# Patient Record
Sex: Male | Born: 1965 | Race: White | Hispanic: No | Marital: Married | Smoking: Never smoker
Health system: Southern US, Community
[De-identification: ages and names within clinical notes are randomized; demographics above are authoritative.]

## PROBLEM LIST (undated history)

## (undated) DIAGNOSIS — I10 Essential (primary) hypertension: Secondary | ICD-10-CM

## (undated) DIAGNOSIS — I2510 Atherosclerotic heart disease of native coronary artery without angina pectoris: Secondary | ICD-10-CM

## (undated) DIAGNOSIS — G4730 Sleep apnea, unspecified: Secondary | ICD-10-CM

## (undated) DIAGNOSIS — I82409 Acute embolism and thrombosis of unspecified deep veins of unspecified lower extremity: Secondary | ICD-10-CM

## (undated) DIAGNOSIS — I499 Cardiac arrhythmia, unspecified: Secondary | ICD-10-CM

## (undated) DIAGNOSIS — M1990 Unspecified osteoarthritis, unspecified site: Secondary | ICD-10-CM

## (undated) DIAGNOSIS — F419 Anxiety disorder, unspecified: Secondary | ICD-10-CM

## (undated) DIAGNOSIS — K219 Gastro-esophageal reflux disease without esophagitis: Secondary | ICD-10-CM

## (undated) DIAGNOSIS — E785 Hyperlipidemia, unspecified: Secondary | ICD-10-CM

## (undated) DIAGNOSIS — Z8719 Personal history of other diseases of the digestive system: Secondary | ICD-10-CM

## (undated) HISTORY — DX: Hyperlipidemia, unspecified: E78.5

## (undated) HISTORY — DX: Essential (primary) hypertension: I10

## (undated) HISTORY — DX: Acute embolism and thrombosis of unspecified deep veins of unspecified lower extremity: I82.409

## (undated) HISTORY — PX: UMBILICAL HERNIA REPAIR: SUR1181

---

## 2004-09-23 ENCOUNTER — Encounter: Admission: RE | Admit: 2004-09-23 | Discharge: 2004-09-23 | Payer: Self-pay | Admitting: Family Medicine

## 2008-07-20 ENCOUNTER — Emergency Department (HOSPITAL_COMMUNITY): Admission: EM | Admit: 2008-07-20 | Discharge: 2008-07-20 | Payer: Self-pay | Admitting: Emergency Medicine

## 2008-09-17 ENCOUNTER — Observation Stay (HOSPITAL_COMMUNITY): Admission: RE | Admit: 2008-09-17 | Discharge: 2008-09-19 | Payer: Self-pay | Admitting: Internal Medicine

## 2008-09-17 ENCOUNTER — Ambulatory Visit: Payer: Self-pay | Admitting: Vascular Surgery

## 2008-09-17 ENCOUNTER — Encounter (INDEPENDENT_AMBULATORY_CARE_PROVIDER_SITE_OTHER): Payer: Self-pay | Admitting: Orthopedic Surgery

## 2008-12-04 ENCOUNTER — Ambulatory Visit: Payer: Self-pay | Admitting: Vascular Surgery

## 2009-09-03 ENCOUNTER — Ambulatory Visit: Payer: Self-pay | Admitting: Vascular Surgery

## 2010-07-14 ENCOUNTER — Emergency Department (HOSPITAL_COMMUNITY)
Admission: EM | Admit: 2010-07-14 | Discharge: 2010-07-14 | Disposition: A | Discharge status: Home / Self Care | Payer: 59 | Attending: Emergency Medicine | Admitting: Emergency Medicine

## 2010-07-14 DIAGNOSIS — W208XXA Other cause of strike by thrown, projected or falling object, initial encounter: Secondary | ICD-10-CM | POA: Insufficient documentation

## 2010-07-14 DIAGNOSIS — S0100XA Unspecified open wound of scalp, initial encounter: Secondary | ICD-10-CM | POA: Insufficient documentation

## 2010-07-14 DIAGNOSIS — Y929 Unspecified place or not applicable: Secondary | ICD-10-CM | POA: Insufficient documentation

## 2010-08-26 LAB — CBC
HCT: 36.3 % — ABNORMAL LOW (ref 39.0–52.0)
Hemoglobin: 12.6 g/dL — ABNORMAL LOW (ref 13.0–17.0)
MCHC: 34.6 g/dL (ref 30.0–36.0)
MCV: 87.4 fL (ref 78.0–100.0)
Platelets: 348 10*3/uL (ref 150–400)
RDW: 12.4 % (ref 11.5–15.5)
RDW: 12.8 % (ref 11.5–15.5)
WBC: 10.1 10*3/uL (ref 4.0–10.5)
WBC: 7.8 10*3/uL (ref 4.0–10.5)

## 2010-08-26 LAB — COMPREHENSIVE METABOLIC PANEL
Alkaline Phosphatase: 100 U/L (ref 39–117)
Chloride: 107 mEq/L (ref 96–112)
Creatinine, Ser: 1.14 mg/dL (ref 0.4–1.5)
GFR calc Af Amer: >60 mL/min (ref >60)
GFR calc non Af Amer: >60 mL/min (ref >60)
Glucose, Bld: 105 mg/dL — ABNORMAL HIGH (ref 70–99)

## 2010-08-26 LAB — APTT: aPTT: 32 seconds (ref 24–37)

## 2010-08-26 LAB — PROTIME-INR
INR: 1.1 (ref 0.00–1.49)
INR: 1.1 (ref 0.00–1.49)
Prothrombin Time: 14 seconds (ref 11.6–15.2)
Prothrombin Time: 14.3 seconds (ref 11.6–15.2)

## 2010-09-30 NOTE — Assessment & Plan Note (Signed)
OFFICE VISIT   [name] [name], [name] [name]  DOB:  [birth year]                                       09/03/2009  [id number]#:[id number]   The patient is a 45 year old male who had suffered a DVT of his right  leg and superficial femoral, popliteal and tibial veins in May of 2010  after Achilles tendon repair by Dr. Bettina Gavia.  He has been on Coumadin  since that time and I evaluated him in July of 2010.  He returns today  for followup.  The swelling which was fairly severe when I saw him in  July has dramatically improved with the ankle now being only 1.5 to 2 cm  larger on the right and the left calf being equal size and distal thigh  being equal size.  He is not wearing an elastic compression stocking on  a regular basis and does notice swelling in the ankle as the day  progresses.  He does elevate the leg somewhat at night.  He continues on  Coumadin which he is tolerating well.   REVIEW OF SYSTEMS:  Denies any chest pain, dyspnea on exertion, PND,  orthopnea, no chronic bronchitis, wheezing or asthma.   PHYSICAL EXAM:  Vital signs:  Blood pressure 136/88, heart rate 51,  respirations 14.  General:  He is alert and oriented x3 in no apparent  distress.  Lower extremity exam reveals 3+ femoral, popliteal and  dorsalis pedis pulses bilaterally.  Right ankle is 2 cm larger in  circumference than the left.  Proximal calf and thigh are equal in  circumference.  He has a few varicosities but no stasis ulcers.   Today I ordered a venous duplex exam which I reviewed and interpreted.  He has no evidence of thrombus in the right superficial femoral or  popliteal vein which is now clear.  Tibials were not well visualized.  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   I think it would be okay to discontinue his Coumadin at this point and  convert to aspirin only.  He should also wear short leg elastic  compression stocking and  elevate his leg effectively at night and return  to see [place] on a p.r.n. basis.     Quita Skye Hart [place], M.D.  Electronically Signed   JDL/MEDQ  D:  09/03/2009  T:  09/04/2009  Job:  [id number]   cc:   Claude Manges, PA

## 2010-09-30 NOTE — H&P (Signed)
[name], [name]              ACCOUNT NO.:  [id number]   MEDICAL RECORD NO.:  [id number]          PATIENT TYPE:  OBV   LOCATION:  4705                         FACILITY:  [facility]   PHYSICIAN:  [name] [name], M.D.DATE OF BIRTH:  10/07/1965   DATE OF ADMISSION:  09/17/2008  DATE OF DISCHARGE:                              HISTORY & PHYSICAL   PRIMARY CARE [name] [name]:  [name] Manges, NP, of [facility]   ORTHOPEDIST:  Dr. Thurston [name].   CHIEF COMPLAINT:  Leg pain and swelling.   HISTORY OF PRESENT ILLNESS:  The patient is a 45 year old white male  with past medical history of borderline hypertension, hyperlipidemia who  underwent a right Achilles tendon repair on August 04, 2008.  Before that  time, he had been somewhat immobile, and afterwards, he had been wearing  a boot.  He followed up today in Dr. Sherene [name] office complaining of  severe leg pain and swelling which had been going on for several days.  He had noted some fevers as of late and brief episode of some chest  discomfort earlier for several weeks back.  When he came in, it was  noted that his right lower extremity was markedly swollen from the mid-  thigh down, and so they sent him over for Doppler for suspected DVT.  The patient's lower extremity Doppler noted a significant DVT involving  most of the patient's right lower extremity.  [facility] were  consulted by the orthopedic physician for admission.  The patient was  then sent from vascular directly to telemetry floor.  He was started on  Lovenox and Coumadin.  When I saw the patient, he was doing well.  He  denies any headaches, vision changes, dysphagia, chest pain,  palpitations, shortness of breath, wheeze, cough, abdominal pain,  hematuria, dysuria, constipation, diarrhea, focal extremity numbness,  weakness, and his pain was better after receiving some medicine for  pain.   REVIEW OF SYSTEMS:  Otherwise negative.   PAST MEDICAL HISTORY:   Includes borderline hypertension, not on any  medicines, and hyperlipidemia.   MEDICATIONS:  He is on Zocor.  He does not remember the dose.  He also  takes Vicodin which he has been __________ as of late after his Achilles  repair in the last month.   ALLERGIES:  NO KNOWN DRUG ALLERGIES.   SOCIAL HISTORY:  Denies any tobacco, heavy alcohol or drug use.   FAMILY HISTORY:  Noncontributory.   PHYSICAL EXAMINATION:  VITALS SIGNS:  On admission to the floor,  temperature 99.1, heart rate 78, blood pressure 138/94, O2 saturation  96% on room air.  IN GENERAL:  He is alert and oriented x3.  In no  apparent distress.  HEENT:  Normocephalic, atraumatic.  Mucous membranes are moist.  NECK:  He has no carotid bruits.  HEART:  Regular rate and rhythm, S1, S2.  LUNGS:  Clear to auscultation bilaterally.  ABDOMEN:  Soft, obese, nontender, positive bowel sounds.  EXTREMITIES:  Show no clubbing or cyanosis.  His right lower extremity  from the mid-thigh down is +1 edema, much more swollen than  the left.  He has normal sensation, intact.   LABORATORY WORK:  I have ordered a CBC, BMET and PT/INR, all of which  are pending.   ASSESSMENT AND PLAN:  1. Deep venous thrombosis.  Will plan to start Lovenox and Coumadin.      The patient is a well informed and educated, and after he is      comfortable with self-administration, we will plan to discharge      home with close followup.  2. Borderline hypertension.  Continue to monitor.  Slightly elevated      secondary to pain.  3. Hyperlipidemia.  Continue Zocor.      [name] [name], M.D.  Electronically Signed     [name] [name]/MEDQ  D:  09/17/2008  T:  09/17/2008  Job:  [id number]   cc:   [name] [name], M.D.  [name] [name], M.D.  [name] Manges, NP

## 2010-09-30 NOTE — Procedures (Signed)
DUPLEX DEEP VENOUS EXAM - LOWER EXTREMITY   INDICATION:  Followup evaluation of previous DVT.   HISTORY:  Edema:  Yes.  Trauma/Surgery:  Yes.  Pain:  Yes.  PE:  No.  Previous DVT:  Yes.  Anticoagulants:  Yes.  Other:   DUPLEX EXAM:                CFV   SFV   PopV  PTV    GSV                R  L  R  L  R  L  R   L  R  L  Thrombosis    o  o  +  o  +  o  o   o  o  o  Spontaneous   +  +  P  +  P  +  +   +  +  +  Phasic        +  +  o  +  o  +  +   +  +  +  Augmentation  +  +  P  +  P  +  +   +  +  +  Compressible  +  +  P  +  P  +  +   +  +  +  Competent     +  +  +  +  +  +  +   +  +  +   Legend:  + - yes  o - no  p - partial  D - decreased    IMPRESSION:  1. Deep vein thrombosis noted in the right mid to distal superficial      femoral and popliteal vein.  2. Superficial vein thrombosis was noted in right lesser saphenous      vein.  3. No evidence of deep vein thrombosis in left lower extremity.  4. Unable to image the right peroneal vein due to swelling in the      calf.         _____________________________  Quita Skye. Hart [place], M.D.   AC/MEDQ  D:  12/04/2008  T:  12/05/2008  Job:  [id number]

## 2010-09-30 NOTE — Consult Note (Signed)
NEW PATIENT CONSULTATION   BLAS, RICHES  DOB:  1965/06/09                                       12/04/2008  [id number]#:[id number]   The patient is a 45 year old healthy male who said Achilles tendon  rupture while playing basketball in March of this year.  He was placed  in a boot after surgical repair and in May when the boot was removed he  had significant edema and was found to have a deep venous thrombosis  fairly extensive in his right leg.  He has been treated with Lovenox and  Coumadin and has had persistent edema, returns now for further  evaluation and recommendations for long-term care.  He has had no  previous history of thrombotic problems and has not been wearing elastic  compression stockings and has had persistent edema from the thigh to the  foot.  He has no symptoms in the left leg.   PAST MEDICAL HISTORY:  1. Hypertension.  2. Hyperlipidemia.  3. Negative for diabetes, coronary artery disease, COPD or stroke.   PAST SURGICAL HISTORY:  1. Achilles tendon repair.  2. Vasectomy.   FAMILY HISTORY:  Positive for coronary artery disease and stroke in his  father who had his first myocardial infarction age 45.  Negative for  diabetes.   SOCIAL HISTORY:  Married, has three children and works as a Scientific laboratory technician.  Does not use tobacco.  Drinks occasional alcohol.   REVIEW OF SYSTEMS:  Positive for some recent weight gain, has had some  discomfort in his legs with ambulation but no other specific symptoms.   ALLERGIES:  None known.   MEDICATIONS:  Include simvastatin and Coumadin.   PHYSICAL EXAM:  Vital signs:  Blood pressure is 120/90, heart rate 64,  respirations 20.  General:  He is a healthy-appearing middle-aged male  in no apparent distress, alert and oriented x3.  Neck:  Supple.  3+  carotid pulses palpable.  No bruits are audible.  Neurological:  Normal.  No palpable adenopathy in the neck.  Chest:  Clear to auscultation.  Cardiovascular:  Regular rhythm.  No murmurs.  Abdomen:  Soft, nontender  with no masses.  He has 3+ femoral, popliteal and dorsalis pedis pulses  bilaterally.  Right leg is obviously swollen compared to the left from  the mid thigh to the foot.  The ankle is 4.5 cm larger in circumference,  mid calf 2.5 cm larger and distal thigh 2.5 cm larger than the  contralateral leg.  There are no varicosities or stasis ulcers noted and  he does have some bluish congestion.   Venous duplex exam today reveals thrombosis of the right mid superficial  femoral vein and distal superficial vein as well as popliteal vein and  thrombosis in the right lesser saphenous vein.  There is no DVT in the  left leg.   I think he should continue on Coumadin for at least 6 months because of  his extensive DVT and persistent edema and at that time we could  possibly stop the Coumadin and give him a trial and if he does not have  further problems he could stay off Coumadin with aspirin only.  I also  instructed him in elevating the leg at night and applying a long leg  elastic compression stocking early in the morning to try to minimize  his  edema.  We fitted him for a stocking today.  He will return to see me in  4-5 months with a followup venous duplex exam.   Quita Skye. Hart [place], M.D.  Electronically Signed   JDL/MEDQ  D:  12/04/2008  T:  12/05/2008  Job:  [id number]   cc:   Dr Beryl Meager A. Thurston Hole, M.D.

## 2010-09-30 NOTE — Procedures (Signed)
DUPLEX DEEP VENOUS EXAM - LOWER EXTREMITY   INDICATION:  Follow up right leg DVT.   HISTORY:  Edema:  Yes.  Trauma/Surgery:  No.  Pain:  No.  PE:  No.  Previous DVT:  Right superficial femoral artery, popliteal, and lesser  saphenous.  Anticoagulants:  Yes.  Other:  No.   DUPLEX EXAM:                CFV   SFV   PopV  PTV         GSV                R  L  R  L  R  L  R        L  R  L  Thrombosis    o  o  o     o     Not visualized    o  Spontaneous   +  +  +     +     Not visualized    +  Phasic        +  +  +     +     Not visualized    +  Augmentation  +  +  +     +     Not visualized    +  Compressible  +  +  +     +     Not visualized    +  Competent     +  +  +     +     Not visualized    +   Legend:  + - yes  o - no  p - partial  D - decreased   IMPRESSION:  1. The right leg appears to be free of deep venous thrombus.  2. The right posterior tibial and peroneals were not visualized well.  3. The right lesser saphenous vein appears with chronic superficial      thrombus.    _____________________________  Quita Skye Hart [place], M.D.   CB/MEDQ  D:  09/03/2009  T:  09/03/2009  Job:  [id number]

## 2010-10-03 NOTE — Discharge Summary (Signed)
NAMECONOR, [name] [name] NO.:  [id number]   MEDICAL RECORD NO.:  [id number]          PATIENT TYPE:  OBV   LOCATION:  4705                         FACILITY:  [facility]   PHYSICIAN:  Hollice Espy, M.D.DATE OF BIRTH:  01-23-1966   DATE OF ADMISSION:  09/17/2008  DATE OF DISCHARGE:  09/19/2008                               DISCHARGE SUMMARY   PRIMARY CARE PHYSICIAN:  Claude Manges, NP, overseen by Lavonda Jumbo,  MD, at [facility].   DISCHARGE DIAGNOSES:  1. Right lower extremity deep venous thrombosis.  2. History of hyperlipidemia.  3. History of hypertension.   DISCHARGE MEDICATIONS:  1. Coumadin 10 p.o. nightly.  2. Lovenox 100 mg subcu q.12 h.   The patient will continue on both medicines until she will follows up  with Dr. Orvan July office and then have a Coumadin level recheck.  Once his  INR is therapeutic between 2-3, we may discontinue on the Lovenox.   Mean time, he continue the rest of his medicines, these are as follows;  1. Zocor 20 nightly.  2. Vicodin 1 tablet p.o. q.4-6 h. p.r.n. for pain.  3. Methocarbamol 500 p.o. daily p.r.n.   DISCHARGE DIET:  Low-sodium diet.   ACTIVITY:  As tolerated   DISPOSITION:  Improved.   HOSPITAL COURSE:  The patient is 45 year old white male with complaining  of some lower extremity swelling x1 day that has a right leg DVT and  vascular lab __________ to be admitted.  He had had a recent orthopedic  procedure.  He was started on Coumadin and Lovenox.  He had had some  previous experience with this.  Given that he had a family member on  Coumadin who is actually medically stable, arrangements were made for  him to go  home on home Lovenox and Coumadin, of which then he could continue to  take both, see Dr. Barry Dienes' office for repeat Coumadin check.  Appointment  was made for him to follow up with Claude Manges at 9:30 a.m. on Sep 20, 2008, one day after discharge.  The patient's overall disposition  is  improved.  He was discharged to home.      Hollice Espy, M.D.  Electronically Signed     SKK/MEDQ  D:  10/24/2008  T:  10/25/2008  Job:  [id number]   cc:   Claude Manges, NP

## 2011-05-15 ENCOUNTER — Ambulatory Visit (INDEPENDENT_AMBULATORY_CARE_PROVIDER_SITE_OTHER): Payer: 59 | Admitting: *Deleted

## 2011-05-15 DIAGNOSIS — R229 Localized swelling, mass and lump, unspecified: Secondary | ICD-10-CM

## 2011-05-15 DIAGNOSIS — I82819 Embolism and thrombosis of superficial veins of unspecified lower extremities: Secondary | ICD-10-CM

## 2011-05-25 NOTE — Procedures (Unsigned)
DUPLEX DEEP VENOUS EXAM - LOWER EXTREMITY  INDICATION:  Knot, right lateral upper calf.  HISTORY:  Edema:  Yes. Trauma/Surgery:  No. Pain:  Yes. PE:  No. Previous DVT:  Right lower extremity deep venous thrombosis 2 years ago, mid thigh distally. Anticoagulants:  No. Other:  DUPLEX EXAM:               CFV   SFV   PopV  PTV    GSV               R  L  R  L  R  L  R   L  R  L Thrombosis    o     +     +     o      o Spontaneous   +     0     0     +      + Phasic        +     +     0     +      + Augmentation  +     +     0     +      + Compressible  +     P     0     +      + Competent     0     0     0     +      +  Legend:  + - yes  o - no  p - partial  D - decreased  IMPRESSION: 1. Partial to no compression in the right distal femoral to the     popliteal vein, age undetermined, cannot rule out acute deep venous     thrombosis. 2. Patient has a prior history of extensive deep venous thrombosis. 3. Noncompressible superficial varicosities at the right lateral calf,     suggestive of acute superficial thrombophlebitis.   _____________________________ V. Charlena Cross, MD  SS/MEDQ  D:  05/15/2011  T:  05/15/2011  Job:  [id number]

## 2011-06-19 ENCOUNTER — Encounter: Payer: Self-pay | Admitting: Vascular Surgery

## 2011-06-22 ENCOUNTER — Encounter: Payer: Self-pay | Admitting: Vascular Surgery

## 2011-06-23 ENCOUNTER — Ambulatory Visit (INDEPENDENT_AMBULATORY_CARE_PROVIDER_SITE_OTHER): Payer: 59 | Admitting: Vascular Surgery

## 2011-06-23 ENCOUNTER — Encounter: Payer: Self-pay | Admitting: Vascular Surgery

## 2011-06-23 VITALS — BP 142/93 | HR 60 | Resp 16 | Ht 72.0 in | Wt 243.0 lb

## 2011-06-23 DIAGNOSIS — I8000 Phlebitis and thrombophlebitis of superficial vessels of unspecified lower extremity: Secondary | ICD-10-CM

## 2011-06-23 DIAGNOSIS — I824Z9 Acute embolism and thrombosis of unspecified deep veins of unspecified distal lower extremity: Secondary | ICD-10-CM

## 2011-06-23 NOTE — Progress Notes (Signed)
Subjective:     Patient ID: [name] [name], male   DOB: 08/14/1965, 46 y.o.   MRN: [id number]  HPI this 46 year old male patient returns for further discussion about his venous disease in the right leg. I evaluated him a few years ago for DVT in the right superficial femoral and popliteal vein which occurred following an Achilles tendon repair. He was treated for Coumadin for a period of time and in the clot lysed and his Coumadin was discontinued following on May 2011 appointment since then he has done well until 6 weeks ago when he developed superficial thrombophlebitis in the lateral aspect of his right calf. He felt a firm knot in this area. He had a venous duplex exam performed in our office on 05/15/2011 which I have reviewed. He had DVT in the right popliteal and superficial femoral vein and thrombosis of superficial varicosities. This DVT was not present in April of 2011. He is not been taking aspirin or Coumadin until after this event and he was started on aspirin. He is not worn elastic compression stockings or regular basis. Short-leg stockings cause swelling above the stocking appear  Past Medical History  Diagnosis Date  . Hyperlipidemia   . Hypertension   . DVT (deep venous thrombosis)     History  Substance Use Topics  . Smoking status: Never Smoker   . Smokeless tobacco: Not on file  . Alcohol Use: Not on file    Family History  Problem Relation Age of Onset  . Stroke Father     Not on File  Current outpatient prescriptions:aspirin 81 MG tablet, Take 81 mg by mouth daily., Disp: , Rfl: ;  Multiple Vitamin (MULITIVITAMIN) LIQD, Take 5 mLs by mouth daily., Disp: , Rfl: ;  omeprazole (PRILOSEC) 10 MG capsule, Take 10 mg by mouth daily., Disp: , Rfl: ;  aspirin 325 MG EC tablet, Take 325 mg by mouth daily., Disp: , Rfl: ;  ciprofloxacin (CIPRO) 500 MG/5ML (10%) suspension, Take by mouth 2 (two) times daily., Disp: , Rfl:  fenofibrate (TRICOR) 48 MG tablet, Take 48 mg by mouth  daily., Disp: , Rfl: ;  fenofibrate micronized (ANTARA) 130 MG capsule, Take 130 mg by mouth daily before breakfast., Disp: , Rfl: ;  sulfacetamide (BLEPH-10) 10 % ophthalmic solution, 1 drop every 3 (three) hours., Disp: , Rfl:   BP 142/93  Pulse 60  Resp 16  Ht 6' (1.829 m)  Wt 243 lb (110.224 kg)  BMI 32.96 kg/m2  SpO2 98%  Body mass index is 32.96 kg/(m^2).        Review of Systems denies chest pain, dyspnea on exertion, PND, orthopnea, hemoptysis, claudication. All systems are negative and a complete review of systems    Objective:   Physical Exam blood pressure 142/93 heart rate 60 respirations 16 General well-developed well-nourished male no apparent stress alert and oriented x3 HEENT normal for age Lungs no rhonchi or wheezing Cardiovascular regular rhythm no murmurs carotid pulses 3+ no audible bruits Abdomen soft nontender with no masses Neurologic normal Musculoskeletal 3 major deformities Lower extremity exam reveals 1-1-1/2 cm of circumference discrepancy right greater than left in the calf and thigh areas. There is no hyperpigmentation or ulceration noted. There are some thrombosed varicosities in the right lateral calf area.    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

## 2011-08-26 ENCOUNTER — Other Ambulatory Visit: Payer: Self-pay | Admitting: Vascular Surgery

## 2014-05-18 HISTORY — PX: COLONOSCOPY: SHX174

## 2016-09-22 ENCOUNTER — Other Ambulatory Visit: Payer: Self-pay | Admitting: *Deleted

## 2016-09-22 DIAGNOSIS — I8000 Phlebitis and thrombophlebitis of superficial vessels of unspecified lower extremity: Secondary | ICD-10-CM

## 2016-10-07 ENCOUNTER — Encounter: Payer: Self-pay | Admitting: Vascular Surgery

## 2016-10-19 ENCOUNTER — Ambulatory Visit (HOSPITAL_COMMUNITY)
Admission: RE | Admit: 2016-10-19 | Discharge: 2016-10-19 | Disposition: A | Discharge status: Home / Self Care | Payer: Managed Care, Other (non HMO) | Source: Ambulatory Visit | Attending: Vascular Surgery | Admitting: Vascular Surgery

## 2016-10-19 ENCOUNTER — Encounter: Payer: Self-pay | Admitting: Vascular Surgery

## 2016-10-19 ENCOUNTER — Ambulatory Visit (INDEPENDENT_AMBULATORY_CARE_PROVIDER_SITE_OTHER): Payer: Managed Care, Other (non HMO) | Admitting: Vascular Surgery

## 2016-10-19 VITALS — BP 152/104 | HR 76 | Temp 98.5°F | Resp 18 | Ht 72.5 in | Wt 256.0 lb

## 2016-10-19 DIAGNOSIS — I82511 Chronic embolism and thrombosis of right femoral vein: Secondary | ICD-10-CM | POA: Diagnosis not present

## 2016-10-19 DIAGNOSIS — I878 Other specified disorders of veins: Secondary | ICD-10-CM | POA: Diagnosis not present

## 2016-10-19 DIAGNOSIS — I8000 Phlebitis and thrombophlebitis of superficial vessels of unspecified lower extremity: Secondary | ICD-10-CM | POA: Diagnosis present

## 2016-10-19 DIAGNOSIS — M7989 Other specified soft tissue disorders: Secondary | ICD-10-CM | POA: Diagnosis present

## 2016-10-19 DIAGNOSIS — I83891 Varicose veins of right lower extremities with other complications: Secondary | ICD-10-CM | POA: Diagnosis not present

## 2016-10-19 NOTE — Progress Notes (Signed)
Subjective:     Patient ID: [name] [name], male   DOB: 1965-06-02, 52 y.o.   MRN: [id number]  HPI This 51 year old male was previously evaluated by me in 2011 and 2013. He had a history of DVT in the right superficial femoral and popliteal vein and was initially treated with anticoagulants. 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 I recommended that he remain on chronic anticoagulation indefinitely. Since that time he has had some decrease in the edema of the right leg. He's had no superficial thrombophlebitis. He is tried elastic compression stockings but they cause discomfort behind his right knee. He has no symptoms in the contralateral left leg. He continues to take Coumadin but no aspirin. He is concerned about his current status of his venous system.  Past Medical History:  Diagnosis Date  . DVT (deep venous thrombosis) ([place])   . Hyperlipidemia   . Hypertension     Social History  Substance Use Topics  . Smoking status: Never Smoker  . Smokeless tobacco: Never Used  . Alcohol use Yes    Family History  Problem Relation Age of Onset  . Stroke Father     Not on File   Current Outpatient Prescriptions:  .  atorvastatin (LIPITOR) 40 MG tablet, Take 40 mg by mouth daily., Disp: , Rfl:  .  Multiple Vitamin (MULITIVITAMIN) LIQD, Take 5 mLs by mouth daily., Disp: , Rfl:  .  omeprazole (PRILOSEC) 10 MG capsule, Take 10 mg by mouth daily., Disp: , Rfl:  .  warfarin (COUMADIN) 5 MG tablet, Take 5 mg by mouth daily., Disp: , Rfl:  .  aspirin 325 MG EC tablet, Take 325 mg by mouth daily., Disp: , Rfl:  .  aspirin 81 MG tablet, Take 81 mg by mouth daily., Disp: , Rfl:  .  ciprofloxacin (CIPRO) 500 MG/5ML (10%) suspension, Take by mouth 2 (two) times daily., Disp: , Rfl:  .  fenofibrate (TRICOR) 48 MG tablet, Take 48 mg by mouth daily.,  Disp: , Rfl:  .  fenofibrate micronized (ANTARA) 130 MG capsule, Take 130 mg by mouth daily before breakfast., Disp: , Rfl:  .  sulfacetamide (BLEPH-10) 10 % ophthalmic solution, 1 drop every 3 (three) hours., Disp: , Rfl:   Vitals:   10/19/16 1346 10/19/16 1355  BP: (!) 166/104 (!) 152/104  Pulse: 76   Resp: 18   Temp: 98.5 F (36.9 C)   TempSrc: Oral   SpO2: 95%   Weight: 256 lb (116.1 kg)   Height: 6' 0.5" (1.842 m)     Body mass index is 34.24 kg/m.         Review of Systems Denies chest pain, dyspnea on exertion, PND, orthopnea, hemoptysis, claudication    Objective:   Physical Exam BP (!) 152/104 (BP Location: Left Arm, Patient Position: Sitting, Cuff Size: Normal)   Pulse 76   Temp 98.5 F (36.9 C) (Oral)   Resp 18   Ht 6' 0.5" (1.842 m)   Wt 256 lb (116.1 kg)   SpO2 95%   BMI 34.24 kg/m     Gen.-alert and oriented x3 in no apparent distress HEENT normal for age Lungs no rhonchi or wheezing Cardiovascular regular rhythm no murmurs carotid pulses 3+ palpable no bruits audible Abdomen soft nontender no palpable masses Musculoskeletal free of  major deformities Skin clear -  no rashes Neurologic normal Lower extremities 3+ femoral and dorsalis pedis pulses palpable bilaterally with no edema on the left 1+ edema on the right below the knee Bulging varicosities lateral calf on the right from the knee level to the lateral malleolus. No hyperpigmentation or ulceration noted.  Today I ordered a venous duplex exam of both legs which I reviewed and interpreted. The right leg now is widely patent with deep vein reflux but no DVT in the superficial femoral vein and popliteal vein that was present in 2013. There is great saphenous reflux distally on the left side but not on the right in the right small saphenous vein is not competent but is not large.       Assessment:     remote history of DVT right superficial femoral popliteal veins with recurrent DVT after  anticoagulation was stopped in 2011 History of superficial thrombophlebitis right leg in 2013 with thrombosed varicosities Current status of right lower extremity venous system is no DVT but deep vein reflux is present     Plan:     No intervention is indicated at this time Would recommend continuing on chronic anticoagulation with Coumadin which he is tolerating well because he did develop recurrent DVT when this was discontinued in the past slide have recommended that he experiment more with short leg elastic compression stockings which may make his legs feel better during the day if he can find some which are comfortable to him Return to see me on a when necessary basis

## 2017-01-06 DIAGNOSIS — K219 Gastro-esophageal reflux disease without esophagitis: Secondary | ICD-10-CM | POA: Insufficient documentation

## 2017-01-06 DIAGNOSIS — Z86718 Personal history of other venous thrombosis and embolism: Secondary | ICD-10-CM | POA: Insufficient documentation

## 2017-01-06 DIAGNOSIS — E7849 Other hyperlipidemia: Secondary | ICD-10-CM | POA: Insufficient documentation

## 2017-01-06 DIAGNOSIS — R7989 Other specified abnormal findings of blood chemistry: Secondary | ICD-10-CM | POA: Insufficient documentation

## 2018-05-28 DIAGNOSIS — Z23 Encounter for immunization: Secondary | ICD-10-CM | POA: Diagnosis not present

## 2018-06-01 ENCOUNTER — Other Ambulatory Visit: Payer: Self-pay | Admitting: Gastroenterology

## 2018-06-01 DIAGNOSIS — R1900 Intra-abdominal and pelvic swelling, mass and lump, unspecified site: Secondary | ICD-10-CM | POA: Diagnosis not present

## 2018-06-01 DIAGNOSIS — K219 Gastro-esophageal reflux disease without esophagitis: Secondary | ICD-10-CM | POA: Diagnosis not present

## 2018-06-01 DIAGNOSIS — R109 Unspecified abdominal pain: Secondary | ICD-10-CM

## 2018-06-02 DIAGNOSIS — G4733 Obstructive sleep apnea (adult) (pediatric): Secondary | ICD-10-CM | POA: Diagnosis not present

## 2018-06-03 ENCOUNTER — Ambulatory Visit
Admission: RE | Admit: 2018-06-03 | Discharge: 2018-06-03 | Disposition: A | Discharge status: Home / Self Care | Payer: 59 | Source: Ambulatory Visit | Attending: Gastroenterology | Admitting: Gastroenterology

## 2018-06-03 DIAGNOSIS — R109 Unspecified abdominal pain: Secondary | ICD-10-CM

## 2018-06-03 DIAGNOSIS — K439 Ventral hernia without obstruction or gangrene: Secondary | ICD-10-CM | POA: Diagnosis not present

## 2018-06-03 MED ORDER — IOPAMIDOL (ISOVUE-300) INJECTION 61%
125.0000 mL | Freq: Once | INTRAVENOUS | Status: AC | PRN
  Administered 2018-06-03: 125 mL via INTRAVENOUS

## 2018-06-08 ENCOUNTER — Other Ambulatory Visit: Payer: Self-pay | Admitting: Gastroenterology

## 2018-06-08 DIAGNOSIS — K769 Liver disease, unspecified: Secondary | ICD-10-CM

## 2018-06-15 DIAGNOSIS — K449 Diaphragmatic hernia without obstruction or gangrene: Secondary | ICD-10-CM | POA: Diagnosis not present

## 2018-06-15 DIAGNOSIS — K219 Gastro-esophageal reflux disease without esophagitis: Secondary | ICD-10-CM | POA: Diagnosis not present

## 2018-06-22 ENCOUNTER — Ambulatory Visit
Admission: RE | Admit: 2018-06-22 | Discharge: 2018-06-22 | Disposition: A | Discharge status: Home / Self Care | Payer: 59 | Source: Ambulatory Visit | Attending: Gastroenterology | Admitting: Gastroenterology

## 2018-06-22 DIAGNOSIS — K769 Liver disease, unspecified: Secondary | ICD-10-CM | POA: Diagnosis not present

## 2018-06-22 DIAGNOSIS — D1801 Hemangioma of skin and subcutaneous tissue: Secondary | ICD-10-CM | POA: Diagnosis not present

## 2018-06-22 MED ORDER — GADOBENATE DIMEGLUMINE 529 MG/ML IV SOLN
20.0000 mL | Freq: Once | INTRAVENOUS | Status: AC | PRN
  Administered 2018-06-22: 20 mL via INTRAVENOUS

## 2018-07-26 DIAGNOSIS — N401 Enlarged prostate with lower urinary tract symptoms: Secondary | ICD-10-CM | POA: Diagnosis not present

## 2018-07-26 DIAGNOSIS — R3121 Asymptomatic microscopic hematuria: Secondary | ICD-10-CM | POA: Diagnosis not present

## 2018-07-26 DIAGNOSIS — K219 Gastro-esophageal reflux disease without esophagitis: Secondary | ICD-10-CM | POA: Diagnosis not present

## 2018-07-26 DIAGNOSIS — R350 Frequency of micturition: Secondary | ICD-10-CM | POA: Diagnosis not present

## 2018-07-26 DIAGNOSIS — I10 Essential (primary) hypertension: Secondary | ICD-10-CM | POA: Diagnosis not present

## 2018-07-26 DIAGNOSIS — E7800 Pure hypercholesterolemia, unspecified: Secondary | ICD-10-CM | POA: Diagnosis not present

## 2018-09-09 DIAGNOSIS — R3912 Poor urinary stream: Secondary | ICD-10-CM | POA: Diagnosis not present

## 2018-09-09 DIAGNOSIS — N401 Enlarged prostate with lower urinary tract symptoms: Secondary | ICD-10-CM | POA: Diagnosis not present

## 2018-09-09 DIAGNOSIS — D3002 Benign neoplasm of left kidney: Secondary | ICD-10-CM | POA: Diagnosis not present

## 2018-09-09 DIAGNOSIS — R3121 Asymptomatic microscopic hematuria: Secondary | ICD-10-CM | POA: Diagnosis not present

## 2018-09-27 DIAGNOSIS — M160 Bilateral primary osteoarthritis of hip: Secondary | ICD-10-CM | POA: Diagnosis not present

## 2018-09-27 DIAGNOSIS — M47816 Spondylosis without myelopathy or radiculopathy, lumbar region: Secondary | ICD-10-CM | POA: Diagnosis not present

## 2019-05-19 HISTORY — PX: INGUINAL HERNIA REPAIR: SUR1180

## 2019-11-06 ENCOUNTER — Other Ambulatory Visit: Payer: Self-pay | Admitting: Family Medicine

## 2019-11-06 ENCOUNTER — Ambulatory Visit
Admission: RE | Admit: 2019-11-06 | Discharge: 2019-11-06 | Disposition: A | Discharge status: Home / Self Care | Payer: 59 | Source: Ambulatory Visit | Attending: Family Medicine | Admitting: Family Medicine

## 2019-11-06 DIAGNOSIS — Z01818 Encounter for other preprocedural examination: Secondary | ICD-10-CM

## 2020-04-21 IMAGING — MR MR ABDOMEN WO/W CM
18 series · 48 of 48 positions shown · IV contrast (Multihance 20ml)
Comparison: CT on 06/03/2018

CLINICAL DATA: Indeterminate liver lesions on recent CT.

Creatinine was obtained on site at [HOSPITAL] at [HOSPITAL].
Results: Creatinine 1.2 mg/dL.
EXAM:
MRI ABDOMEN WITHOUT AND WITH CONTRAST
TECHNIQUE: Multiplanar multisequence MR imaging of the abdomen was performed
both before and after the administration of intravenous contrast.
CONTRAST:  20mL MULTIHANCE GADOBENATE DIMEGLUMINE 529 MG/ML IV SOLN

[Series 3: T2 · coronal · 5.0mm · 1.56mm/px · 2 of 40 slices shown (1 of 3)]
[im 1/40]
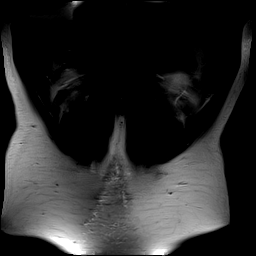
[im 40/40]
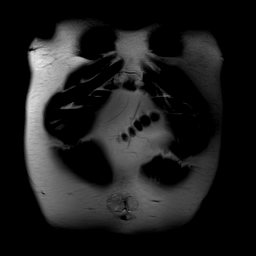

[Series 4: T1 · axial · 3.0mm · 1.19mm/px · z∈[-89,+124]mm · 5 of 144 slices shown]
[im 1/144]
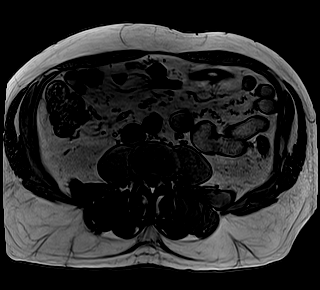
[im 36/144]
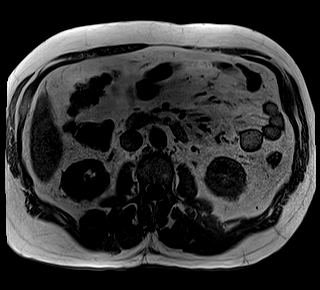
[im 72/144]
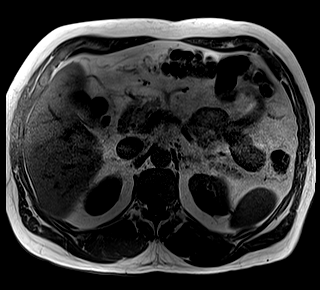
[im 108/144]
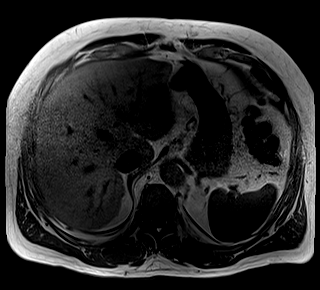
[im 144/144]
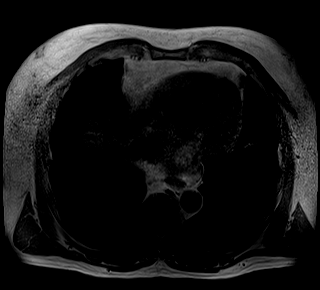

[Series 5: bSSFP · axial · 5.0mm · 1.25mm/px · 1 of 40 slices shown]
[im 1/40]
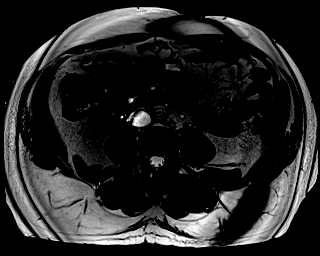

[Series 6: T2 · axial · 6.0mm · 1.19mm/px · 1 of 35 slices shown (2 of 3)]
[im 1/35]
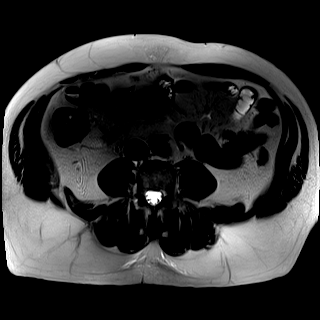

[Series 7: T2 · axial · 5.0mm · 1.48mm/px · 1 of 42 slices shown (3 of 3)]
[im 1/42]
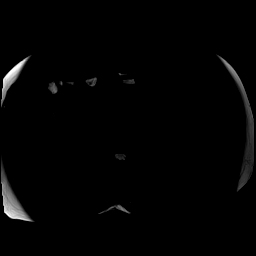

[Series 8: DWI · axial · 5.0mm · 1.42mm/px · z∈[-71,+175]mm · 4 of 126 slices shown (1 of 2)]
[im 1/126]
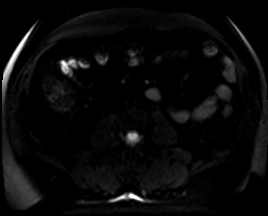
[im 42/126]
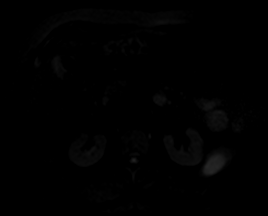
[im 84/126]
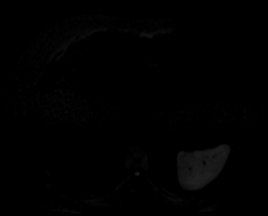
[im 126/126]
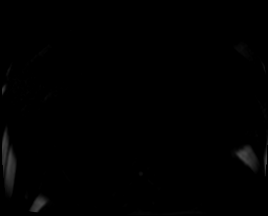

[Series 9: DWI · axial · 5.0mm · 1.42mm/px · 1 of 42 slices shown (2 of 2)]
[im 1/42]
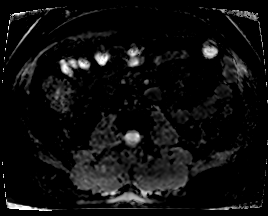

[Series 10: T1 dynamic · axial · non-contrast · 3.0mm · 1.25mm/px · z∈[-106,+131]mm · 3 of 80 slices shown (1 of 2)]
[im 1/80]
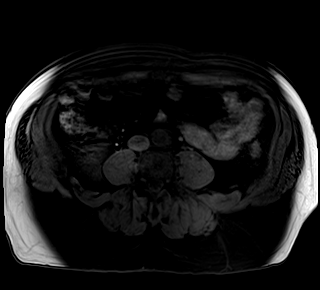
[im 40/80]
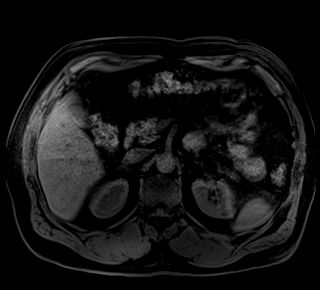
[im 80/80]
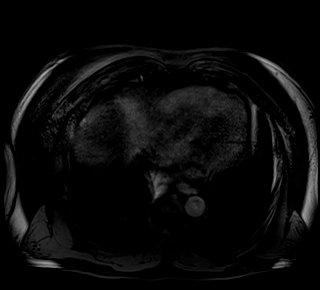

[Series 11: T1 dynamic · axial · non-contrast · 3.0mm · 1.25mm/px · z∈[-106,+131]mm · 3 of 80 slices shown (2 of 2)]
[im 1/80]
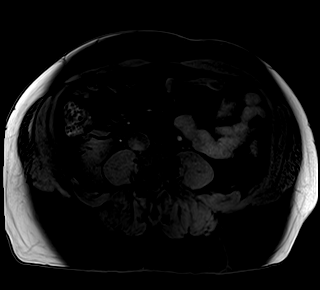
[im 40/80]
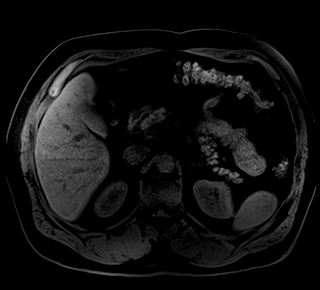
[im 80/80]
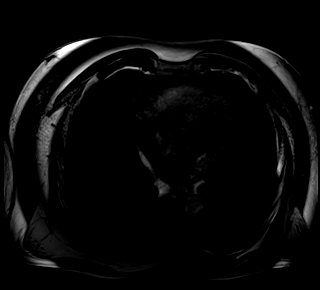

[Series 12: T1 dynamic post-contrast · axial · 3.0mm · 1.25mm/px · z∈[-106,+131]mm · 3 of 80 slices shown (1 of 9)]
[im 1/80]
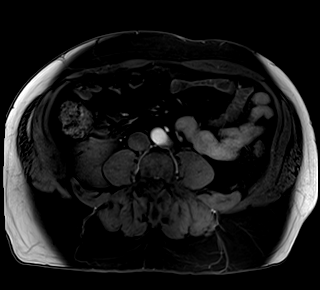
[im 40/80]
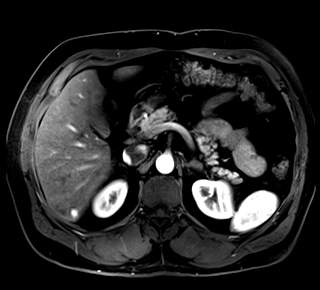
[im 80/80]
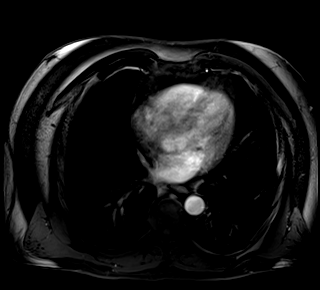

[Series 13: T1 dynamic post-contrast · axial · 3.0mm · 1.25mm/px · z∈[-106,+131]mm · 3 of 80 slices shown (2 of 9)]
[im 1/80]
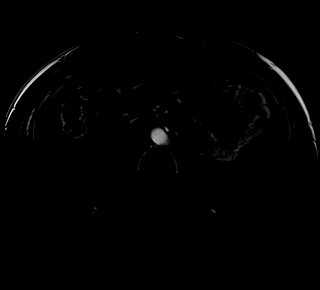
[im 40/80]
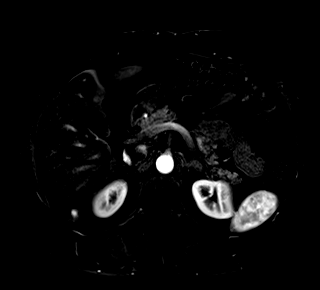
[im 80/80]
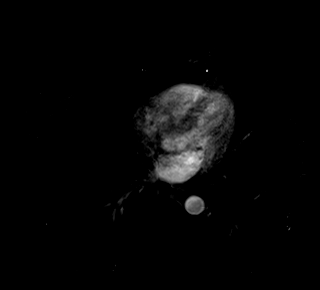

[Series 14: T1 dynamic post-contrast · axial · 3.0mm · 1.25mm/px · z∈[-106,+131]mm · 3 of 80 slices shown (3 of 9)]
[im 1/80]
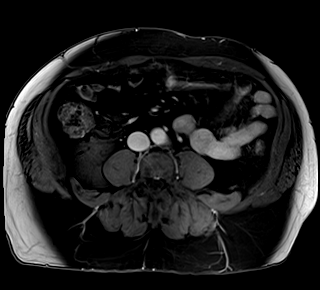
[im 40/80]
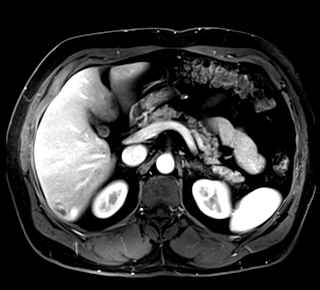
[im 80/80]
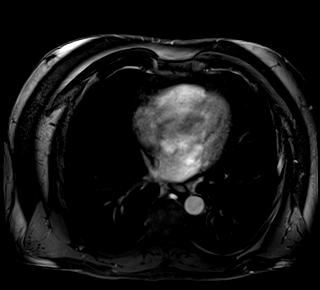

[Series 15: T1 dynamic post-contrast · axial · 3.0mm · 1.25mm/px · z∈[-106,+131]mm · 3 of 80 slices shown (4 of 9)]
[im 1/80]
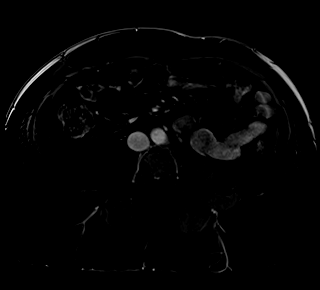
[im 40/80]
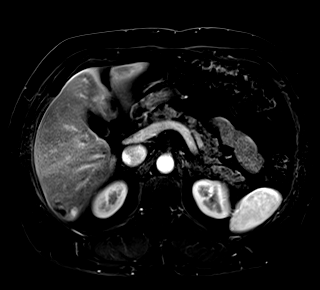
[im 80/80]
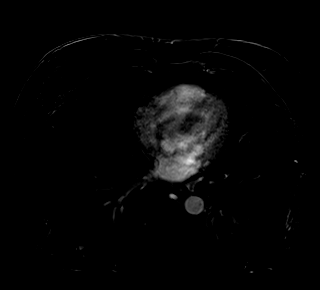

[Series 16: T1 dynamic post-contrast · axial · 3.0mm · 1.25mm/px · z∈[-106,+131]mm · 3 of 80 slices shown (5 of 9)]
[im 1/80]
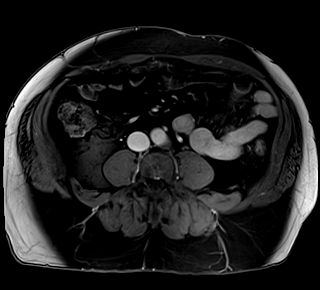
[im 40/80]
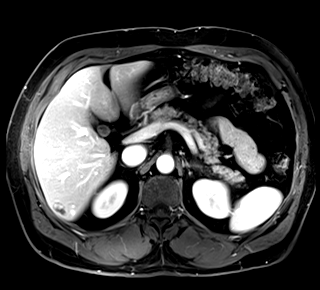
[im 80/80]
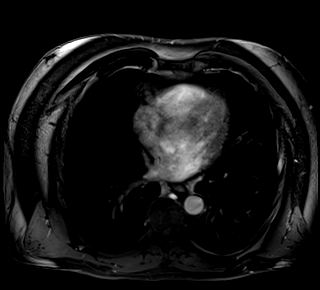

[Series 17: T1 dynamic post-contrast · axial · 3.0mm · 1.25mm/px · z∈[-106,+131]mm · 3 of 80 slices shown (6 of 9)]
[im 1/80]
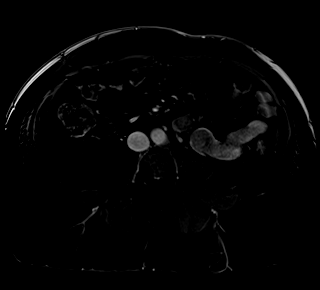
[im 40/80]
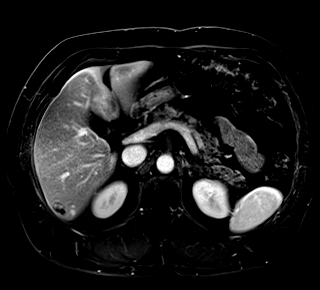
[im 80/80]
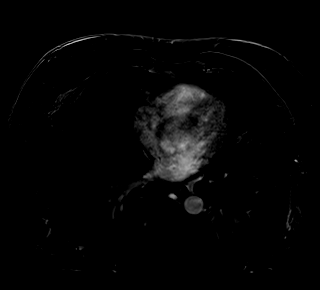

[Series 18: T1 dynamic post-contrast · coronal · 3.0mm · 1.25mm/px · 3 of 80 slices shown (7 of 9)]
[im 1/80]
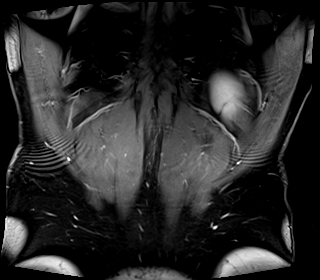
[im 40/80]
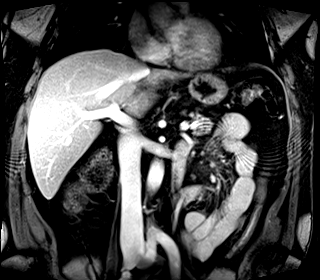
[im 80/80]
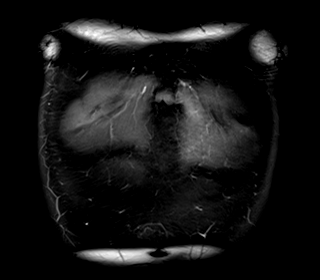

[Series 19: T1 dynamic post-contrast · axial · 3.0mm · 1.25mm/px · z∈[-106,+131]mm · 3 of 80 slices shown (8 of 9)]
[im 1/80]
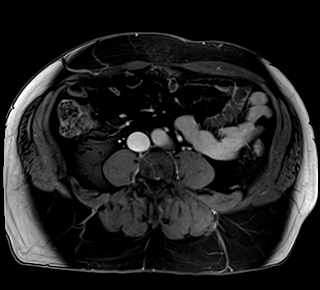
[im 40/80]
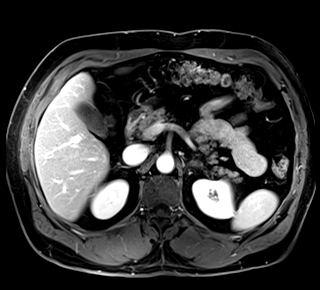
[im 80/80]
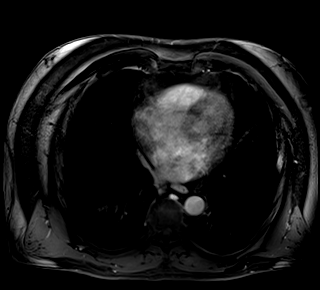

[Series 20: T1 dynamic post-contrast · axial · 3.0mm · 1.25mm/px · z∈[-106,+131]mm · 3 of 80 slices shown (9 of 9)]
[im 1/80]
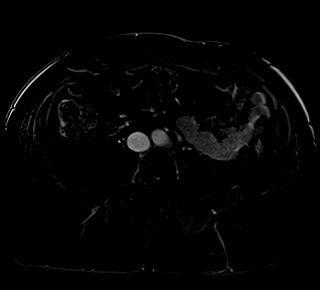
[im 40/80]
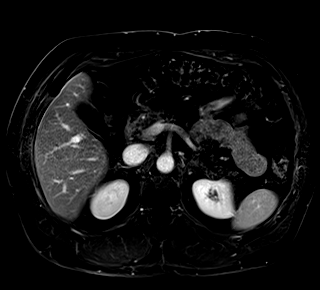
[im 80/80]
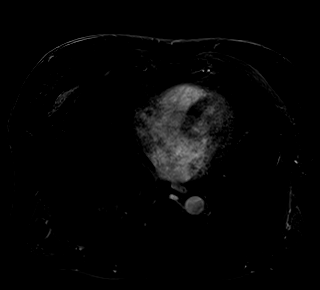

[48 of 48 positions shown; findings below may reference images not displayed]

FINDINGS: Lower chest: No acute findings.

Hepatobiliary: A 3.8 cm benign hemangioma is seen in segment 2. Two
adjacent tiny hemangiomas are seen in segment 6, largest measuring
1.7 cm. These correspond to the lesion seen on recent CT. In
addition, there is a tiny subcapsular vascular shunt noted in the
posterior right lobe.

Gallbladder is unremarkable. No evidence of biliary ductal
dilatation.

Pancreas:  No mass or inflammatory changes.

Spleen:  Within normal limits in size and appearance.

Adrenals/Urinary Tract: Normal appearance of adrenal glands and
right kidney. A 1.4 cm fat signal intensity mass is seen in the
lower pole of the left kidney, consistent with a benign
angiomyolipoma. No evidence of hydronephrosis.

Stomach/Bowel: Visualized portion unremarkable.

Vascular/Lymphatic: No pathologically enlarged lymph nodes
identified. No abdominal aortic aneurysm.

Other:  None.

Musculoskeletal:  No suspicious bone lesions identified.
IMPRESSION: Several benign hepatic hemangiomas, which correspond with the liver
lesions seen on previous CT. No evidence of malignancy.

Small benign left renal angiomyolipoma.

## 2022-07-07 ENCOUNTER — Ambulatory Visit (INDEPENDENT_AMBULATORY_CARE_PROVIDER_SITE_OTHER): Payer: Managed Care, Other (non HMO) | Admitting: Sports Medicine

## 2022-07-07 ENCOUNTER — Ambulatory Visit: Payer: Self-pay

## 2022-07-07 ENCOUNTER — Ambulatory Visit (INDEPENDENT_AMBULATORY_CARE_PROVIDER_SITE_OTHER): Payer: Managed Care, Other (non HMO) | Admitting: Orthopaedic Surgery

## 2022-07-07 ENCOUNTER — Encounter: Payer: Self-pay | Admitting: Orthopaedic Surgery

## 2022-07-07 ENCOUNTER — Ambulatory Visit (INDEPENDENT_AMBULATORY_CARE_PROVIDER_SITE_OTHER): Payer: Managed Care, Other (non HMO)

## 2022-07-07 DIAGNOSIS — M1611 Unilateral primary osteoarthritis, right hip: Secondary | ICD-10-CM | POA: Insufficient documentation

## 2022-07-07 DIAGNOSIS — I82511 Chronic embolism and thrombosis of right femoral vein: Secondary | ICD-10-CM

## 2022-07-07 DIAGNOSIS — M25552 Pain in left hip: Secondary | ICD-10-CM | POA: Diagnosis not present

## 2022-07-07 MED ORDER — METHYLPREDNISOLONE ACETATE 40 MG/ML IJ SUSP
40.0000 mg | INTRAMUSCULAR | Status: AC | PRN
  Administered 2022-07-07: 40 mg via INTRA_ARTICULAR

## 2022-07-07 MED ORDER — BUPIVACAINE HCL 0.5 % IJ SOLN
3.0000 mL | INTRAMUSCULAR | Status: AC | PRN
  Administered 2022-07-07: 3 mL via INTRA_ARTICULAR

## 2022-07-07 MED ORDER — LIDOCAINE HCL 1 % IJ SOLN
4.0000 mL | INTRAMUSCULAR | Status: AC | PRN
  Administered 2022-07-07: 4 mL

## 2022-07-07 MED ORDER — METHYLPREDNISOLONE ACETATE 40 MG/ML IJ SUSP
80.0000 mg | INTRAMUSCULAR | Status: AC | PRN
  Administered 2022-07-07: 80 mg via INTRA_ARTICULAR

## 2022-07-07 MED ORDER — LIDOCAINE HCL 1 % IJ SOLN
3.0000 mL | INTRAMUSCULAR | Status: AC | PRN
  Administered 2022-07-07: 3 mL

## 2022-07-07 NOTE — Progress Notes (Signed)
   Procedure Note  Patient: [name] [name]             Date of Birth: January 23, 1966           MRN: [id number]             Visit Date: 07/07/2022  Procedures: Visit Diagnoses:  1. Primary osteoarthritis of right hip    Large Joint Inj: R hip joint on 07/07/2022 9:47 AM Indications: pain Details: 22 G 3.5 in needle, ultrasound-guided anterior approach Medications: 4 mL lidocaine 1 %; 80 mg methylPREDNISolone acetate 40 MG/ML Outcome: tolerated well, no immediate complications  Procedure: US-guided intra-articular hip injection, right   After discussion on risks/benefits/indications and informed verbal consent was obtained, a timeout was performed. Patient was lying supine on exam table. The hip was cleaned with betadine and alcohol swabs. Then utilizing ultrasound guidance, the patient's femoral head and neck junction was identified and subsequently injected with 4:2 lidocaine:depomedrol via an in-plane approach with ultrasound visualization of the injectate administered into the hip joint. Patient tolerated procedure well without immediate complications.  Procedure, treatment alternatives, risks and benefits explained, specific risks discussed. Consent was given by the patient. Immediately prior to procedure a time out was called to verify the correct patient, procedure, equipment, support staff and site/side marked as required. Patient was prepped and draped in the usual sterile fashion.     - I evaluated the patient about 10 minutes post-injection and he had improvement in pain and range of motion - follow-up with Dr. Erlinda Hong as indicated; I am happy to see them as needed  Elba Barman, DO [place]  This note was dictated using Dragon naturally speaking software and may contain errors in syntax, spelling, or content which have not been identified prior to signing this note.

## 2022-07-07 NOTE — Progress Notes (Signed)
Office Visit Note   Patient: [name] [name]           Date of Birth: [birth year]           MRN: [id number] Visit Date: 07/07/2022              Requested by: Aura Dials, [address] [place],  [place] [id number] PCP: Aura Dials, MD   Assessment & Plan: Visit Diagnoses:  1. Primary osteoarthritis of right hip   2. Pain in left hip   3. Chronic deep vein thrombosis (DVT) 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)     Plan: Regards to the right hip he has end-stage osteoarthritis that is bone-on-bone.  Treatment options were discussed and he is very interested in moving forward with a hip replacement.  He has a few commitments in the next few months that he would like to do therefore we will do a joint injection today to give him some relief.  In regards to the left hip I think he is getting trochanteric bursitis as a result of compensation for the right hip.  Cortisone injection injected today.  Home exercises provided.  Jackelyn Poling will call the patient to schedule surgery.  Patient will need to stop Xarelto 3 days prior to surgery.  Follow-Up Instructions: No follow-ups on file.   Orders:  Orders Placed This Encounter  Procedures   Large Joint Inj: L greater trochanter   XR HIPS BILAT W OR W/O PELVIS 3-4 VIEWS   No orders of the defined types were placed in this encounter.     Procedures: Large Joint Inj: L greater trochanter on 07/07/2022 8:32 AM Indications: pain Details: 22 G needle Medications: 3 mL lidocaine 1 %; 3 mL bupivacaine 0.5 %; 40 mg methylPREDNISolone acetate 40 MG/ML Outcome: tolerated well, no immediate complications Patient was prepped and draped in the usual sterile fashion.       Clinical Data: No additional findings.   Subjective: Chief Complaint  Patient presents with   Right Hip - Pain   Left Hip - Pain    HPI  [name] [name] is a very pleasant 57 year old gentleman here for evaluation of bilateral hip pain.  In terms of the right  hip he feels deep-seated hip and groin pain with radiation to the thigh.  This causes him to have difficulty with ambulation and recently started having a left lateral hip pain that is not constant.  Denies any numbness and tingling.  Takes Xarelto lifelong for history of DVT.  Pain is altering ADLs and quality of life.  Review of Systems  Constitutional: Negative.   HENT: Negative.    Eyes: Negative.   Respiratory: Negative.    Cardiovascular: Negative.   Gastrointestinal: Negative.   Endocrine: Negative.   Genitourinary: Negative.   Skin: Negative.   Allergic/Immunologic: Negative.   Neurological: Negative.   Hematological: Negative.   Psychiatric/Behavioral: Negative.    All other systems reviewed and are negative.    Objective: Vital Signs: There were no vitals taken for this visit.  Physical Exam Vitals and nursing note reviewed.  Constitutional:      Appearance: He is well-developed.  HENT:     Head: Normocephalic and atraumatic.  Eyes:     Pupils: Pupils are equal, round, and reactive to light.  Pulmonary:     Effort: Pulmonary effort is normal.  Abdominal:     Palpations: Abdomen is soft.  Musculoskeletal:  General: Normal range of motion.     Cervical back: Neck supple.  Skin:    General: Skin is warm.  Neurological:     Mental Status: He is alert and oriented to person, place, and time.  Psychiatric:        Behavior: Behavior normal.        Thought Content: Thought content normal.        Judgment: Judgment normal.     Ortho Exam  Examination right hip shows severe pain with attempted hip range of motion.  Antalgic gait.  Examination of left hip shows point tenderness to the lateral trochanter.  He has decent hip range of motion without significant pain.  Specialty Comments:  No specialty comments available.  Imaging: XR HIPS BILAT W OR W/O PELVIS 3-4 VIEWS  Result Date: 07/07/2022 X-rays demonstrate severe osteoarthritis with bone-on-bone  joint space narrowing.    PMFS History: Patient Active Problem List   Diagnosis Date Noted   Primary osteoarthritis of right hip 07/07/2022   Pain in left hip 07/07/2022   Varicose veins of right lower extremity with complications [id number]   Chronic deep vein thrombosis (DVT) of femoral vein of right lower extremity ([place]) 10/19/2016   Acute venous embolism and thrombosis of deep vessels of distal lower extremity ([place]) 06/23/2011   Phlebitis and thrombophlebitis of superficial vessels of lower extremities 06/23/2011   Past Medical History:  Diagnosis Date   DVT (deep venous thrombosis) ([place])    Hyperlipidemia    Hypertension     Family History  Problem Relation Age of Onset   Stroke Father     History reviewed. No pertinent surgical history. Social History   Occupational History   Not on file  Tobacco Use   Smoking status: Never   Smokeless tobacco: Never  Vaping Use   Vaping Use: Never used  Substance and Sexual Activity   Alcohol use: Yes   Drug use: No   Sexual activity: Not on file

## 2022-07-17 ENCOUNTER — Encounter: Payer: Self-pay | Admitting: Orthopaedic Surgery

## 2022-09-24 ENCOUNTER — Encounter: Payer: Self-pay | Admitting: Orthopaedic Surgery

## 2022-09-24 NOTE — Telephone Encounter (Signed)
Do you need surgery sheet?

## 2022-09-30 ENCOUNTER — Other Ambulatory Visit: Payer: Self-pay

## 2022-10-16 NOTE — Pre-Procedure Instructions (Signed)
Surgical Instructions    Your procedure is scheduled on October 26, 2022.  Report to [facility] Main Entrance "A" at 6:30 A.M., then check in with the Admitting office.  Call this number if you have problems the morning of surgery:  [phone]  If you have any questions prior to your surgery date call [phone]: Open Monday-Friday 8am-4pm If you experience any cold or flu symptoms such as cough, fever, chills, shortness of breath, etc. between now and your scheduled surgery, please notify us at the above number.     Remember:  Do not eat after midnight the night before your surgery  You may drink clear liquids until 6:00 AM the morning of your surgery.   Clear liquids allowed are: Water, Non-Citrus Juices (without pulp), Carbonated Beverages, Clear Tea, Black Coffee Only (NO MILK, CREAM OR POWDERED CREAMER of any kind), and Gatorade.  Patient Instructions  The night before surgery:  No food after midnight. ONLY clear liquids after midnight  The day of surgery (if you do NOT have diabetes):  Drink ONE (1) Pre-Surgery Clear Ensure by 6:00 AM the morning of surgery. Drink in one sitting. Do not sip.  This drink was given to you during your hospital  pre-op appointment visit.  Nothing else to drink after completing the  Pre-Surgery Clear Ensure.         If you have questions, please contact your surgeon's office.     Take these medicines the morning of surgery with A SIP OF WATER:  atorvastatin (LIPITOR)   sertraline (ZOLOFT)   tamsulosin (FLOMAX)    Follow your surgeon's instructions on when to stop XARELTO.  If no instructions were given by your surgeon then you will need to call the office to get those instructions.     As of today, STOP taking any Aspirin (unless otherwise instructed by your surgeon) Aleve, Naproxen, Ibuprofen, Motrin, Advil, Goody's, BC's, all herbal medications, fish oil, and all vitamins.                     Do NOT Smoke (Tobacco/Vaping) for 24  hours prior to your procedure.  If you use a CPAP at night, you may bring your mask/headgear for your overnight stay.   Contacts, glasses, piercing's, hearing aid's, dentures or partials may not be worn into surgery, please bring cases for these belongings.    For patients admitted to the hospital, discharge time will be determined by your treatment team.   Patients discharged the day of surgery will not be allowed to drive home, and someone needs to stay with them for 24 hours.  SURGICAL WAITING ROOM VISITATION Patients having surgery or a procedure may have no more than 2 support people in the waiting area - these visitors may rotate.   Children under the age of 62 must have an adult with them who is not the patient. If the patient needs to stay at the hospital during part of their recovery, the visitor guidelines for inpatient rooms apply. Pre-op nurse will coordinate an appropriate time for 1 support person to accompany patient in pre-op.  This support person may not rotate.   Please refer to the [facility] website for the visitor guidelines for Inpatients (after your surgery is over and you are in a regular room).   If you received a COVID test during your pre-op visit  it is requested that you wear a mask when out in public, stay away from anyone that may not be feeling  well and notify your surgeon if you develop symptoms. If you have been in contact with anyone that has tested positive in the last 10 days please notify you surgeon.    Pre-operative 5 CHG Bath Instructions   You can play a key role in reducing the risk of infection after surgery. Your skin needs to be as free of germs as possible. You can reduce the number of germs on your skin by washing with CHG (chlorhexidine gluconate) soap before surgery. CHG is an antiseptic soap that kills germs and continues to kill germs even after washing.   DO NOT use if you have an allergy to chlorhexidine/CHG or antibacterial soaps. If  your skin becomes reddened or irritated, stop using the CHG and notify one of our RNs at [phone].   Please shower with the CHG soap starting 4 days before surgery using the following schedule:     Please keep in mind the following:  DO NOT shave, including legs and underarms, starting the day of your first shower.   You may shave your face at any point before/day of surgery.  Place clean sheets on your bed the day you start using CHG soap. Use a clean washcloth (not used since being washed) for each shower. DO NOT sleep with pets once you start using the CHG.   CHG Shower Instructions:  If you choose to wash your hair and private area, wash first with your normal shampoo/soap.  After you use shampoo/soap, rinse your hair and body thoroughly to remove shampoo/soap residue.  Turn the water OFF and apply about 3 tablespoons (45 ml) of CHG soap to a CLEAN washcloth.  Apply CHG soap ONLY FROM YOUR NECK DOWN TO YOUR TOES (washing for 3-5 minutes)  DO NOT use CHG soap on face, private areas, open wounds, or sores.  Pay special attention to the area where your surgery is being performed.  If you are having back surgery, having someone wash your back for you may be helpful. Wait 2 minutes after CHG soap is applied, then you may rinse off the CHG soap.  Pat dry with a clean towel  Put on clean clothes/pajamas   If you choose to wear lotion, please use ONLY the CHG-compatible lotions on the back of this paper.     Additional instructions for the day of surgery: DO NOT APPLY any lotions, deodorants, cologne, or perfumes.   Do not wear jewelry or makeup Do not wear nail polish, gel polish, artificial nails, or any other type of covering on natural nails (fingers and toes) Do not bring valuables to the hospital. [facility] is not responsible for any belongings or valuables. Put on clean/comfortable clothes.  Brush your teeth.  Ask your nurse before applying any prescription medications to  the skin.      CHG Compatible Lotions   Aveeno Moisturizing lotion  Cetaphil Moisturizing Cream  Cetaphil Moisturizing Lotion  Clairol Herbal Essence Moisturizing Lotion, Dry Skin  Clairol Herbal Essence Moisturizing Lotion, Extra Dry Skin  Clairol Herbal Essence Moisturizing Lotion, Normal Skin  Curel Age Defying Therapeutic Moisturizing Lotion with Alpha Hydroxy  Curel Extreme Care Body Lotion  Curel Soothing Hands Moisturizing Hand Lotion  Curel Therapeutic Moisturizing Cream, Fragrance-Free  Curel Therapeutic Moisturizing Lotion, Fragrance-Free  Curel Therapeutic Moisturizing Lotion, Original Formula  Eucerin Daily Replenishing Lotion  Eucerin Dry Skin Therapy Plus Alpha Hydroxy Crme  Eucerin Dry Skin Therapy Plus Alpha Hydroxy Lotion  Eucerin Original Crme  Eucerin Original Lotion  Eucerin Plus  Crme Eucerin Plus Lotion  Eucerin TriLipid Replenishing Lotion  Keri Anti-Bacterial Hand Lotion  Keri Deep Conditioning Original Lotion Dry Skin Formula Softly Scented  Keri Deep Conditioning Original Lotion, Fragrance Free Sensitive Skin Formula  Keri Lotion Fast Absorbing Fragrance Free Sensitive Skin Formula  Keri Lotion Fast Absorbing Softly Scented Dry Skin Formula  Keri Original Lotion  Keri Skin Renewal Lotion Keri Silky Smooth Lotion  Keri Silky Smooth Sensitive Skin Lotion  Nivea Body Creamy Conditioning Oil  Nivea Body Extra Enriched Lotion  Nivea Body Original Lotion  Nivea Body Sheer Moisturizing Lotion Nivea Crme  Nivea Skin Firming Lotion  NutraDerm 30 Skin Lotion  NutraDerm Skin Lotion  NutraDerm Therapeutic Skin Cream  NutraDerm Therapeutic Skin Lotion  ProShield Protective Hand Cream  Provon moisturizing lotion    Please read over the following fact sheets that you were given.

## 2022-10-19 ENCOUNTER — Other Ambulatory Visit: Payer: Self-pay

## 2022-10-19 ENCOUNTER — Encounter (HOSPITAL_COMMUNITY): Payer: Self-pay

## 2022-10-19 ENCOUNTER — Encounter (HOSPITAL_COMMUNITY)
Admission: RE | Admit: 2022-10-19 | Discharge: 2022-10-19 | Disposition: A | Discharge status: Home / Self Care | Payer: Managed Care, Other (non HMO) | Source: Ambulatory Visit | Attending: Orthopaedic Surgery | Admitting: Orthopaedic Surgery

## 2022-10-19 VITALS — BP 135/90 | HR 66 | Temp 98.3°F | Resp 18 | Ht 72.0 in | Wt 240.2 lb

## 2022-10-19 DIAGNOSIS — Z01812 Encounter for preprocedural laboratory examination: Secondary | ICD-10-CM | POA: Insufficient documentation

## 2022-10-19 DIAGNOSIS — M1611 Unilateral primary osteoarthritis, right hip: Secondary | ICD-10-CM | POA: Diagnosis not present

## 2022-10-19 DIAGNOSIS — Z01818 Encounter for other preprocedural examination: Secondary | ICD-10-CM

## 2022-10-19 HISTORY — DX: Personal history of other diseases of the digestive system: Z87.19

## 2022-10-19 HISTORY — DX: Gastro-esophageal reflux disease without esophagitis: K21.9

## 2022-10-19 HISTORY — DX: Unspecified osteoarthritis, unspecified site: M19.90

## 2022-10-19 HISTORY — DX: Anxiety disorder, unspecified: F41.9

## 2022-10-19 LAB — BASIC METABOLIC PANEL
Anion gap: 7 (ref 5–15)
BUN: 15 mg/dL (ref 6–20)
CO2: 26 mmol/L (ref 22–32)
Calcium: 8.8 mg/dL — ABNORMAL LOW (ref 8.9–10.3)
Chloride: 105 mmol/L (ref 98–111)
Creatinine, Ser: 1.03 mg/dL (ref 0.61–1.24)
GFR, Estimated: >60 mL/min (ref >60)
Glucose, Bld: 107 mg/dL — ABNORMAL HIGH (ref 70–99)
Potassium: 3.9 mmol/L (ref 3.5–5.1)
Sodium: 138 mmol/L (ref 135–145)

## 2022-10-19 LAB — TYPE AND SCREEN
ABO/RH(D): A POS
Antibody Screen: NEGATIVE

## 2022-10-19 LAB — CBC
HCT: 41.9 % (ref 39.0–52.0)
Hemoglobin: 14 g/dL (ref 13.0–17.0)
MCH: 32 pg (ref 26.0–34.0)
MCHC: 33.4 g/dL (ref 30.0–36.0)
MCV: 95.9 fL (ref 80.0–100.0)
Platelets: 230 10*3/uL (ref 150–400)
RBC: 4.37 MIL/uL (ref 4.22–5.81)
RDW: 12.2 % (ref 11.5–15.5)
WBC: 6 10*3/uL (ref 4.0–10.5)
nRBC: 0 % (ref 0.0–0.2)

## 2022-10-19 LAB — SURGICAL PCR SCREEN
MRSA, PCR: NEGATIVE
Staphylococcus aureus: POSITIVE — AB

## 2022-10-19 NOTE — Progress Notes (Signed)
PCP - Dr. Darrow Bussing Cardiologist - Denies  PPM/ICD - Denies Device Orders - n/a Rep Notified - n/a  Chest x-ray - Denies EKG - May 2024 (CE) - Tracing Requested Stress Test - Denies ECHO - Denies Cardiac Cath - Denies  Sleep Study - +OSA. Wears CPAP nightly. Pt does not know pressure settings.  No DM  Last dose of GLP1 agonist- n/a GLP1 instructions: n/a  Blood Thinner Instructions: Pt instructed to hold Xarelto for 3 days. Last dose will be June 6th. Aspirin Instructions: n/a  ERAS Protcol - Clear liquids until 0600 morning of surgery PRE-SURGERY Ensure or G2- Ensure given to pt with instructions  COVID TEST- n/a   Anesthesia review: Yes. Medical Clearance from PCP. Pt is on Xarelto for hx of DVT in leg after injury. EKG tracing requested from PCP   Patient denies shortness of breath, fever, cough and chest pain at PAT appointment. Pt denies any respiratory illness/infection in the last two months.   All instructions explained to the patient, with a verbal understanding of the material. Patient agrees to go over the instructions while at home for a better understanding. Patient also instructed to self quarantine after being tested for COVID-19. The opportunity to ask questions was provided.

## 2022-10-20 ENCOUNTER — Other Ambulatory Visit: Payer: Self-pay | Admitting: Physician Assistant

## 2022-10-20 MED ORDER — OXYCODONE-ACETAMINOPHEN 5-325 MG PO TABS: 1.0000 | ORAL_TABLET | Freq: Four times a day (QID) | ORAL | 0 refills | Status: DC | PRN

## 2022-10-20 MED ORDER — METHOCARBAMOL 750 MG PO TABS: 750.0000 mg | ORAL_TABLET | Freq: Two times a day (BID) | ORAL | 2 refills | Status: DC | PRN

## 2022-10-20 MED ORDER — DOCUSATE SODIUM 100 MG PO CAPS
100.0000 mg | ORAL_CAPSULE | Freq: Every day | ORAL | 2 refills | Status: DC | PRN
Start: 2022-10-20 — End: 2022-10-21

## 2022-10-20 MED ORDER — ONDANSETRON HCL 4 MG PO TABS: 4.0000 mg | ORAL_TABLET | Freq: Three times a day (TID) | ORAL | 0 refills | Status: DC | PRN

## 2022-10-20 NOTE — Anesthesia Preprocedure Evaluation (Addendum)
Anesthesia Evaluation  Patient identified by MRN, date of birth, ID band Patient awake    Reviewed: Allergy & Precautions, H&P , NPO status , Patient's Chart, lab work & pertinent test results  Airway Mallampati: II  TM Distance: >3 FB Neck ROM: Full    Dental no notable dental hx.    Pulmonary neg pulmonary ROS   Pulmonary exam normal breath sounds clear to auscultation       Cardiovascular hypertension, Pt. on medications negative cardio ROS Normal cardiovascular exam Rhythm:Regular Rate:Normal     Neuro/Psych   Anxiety     negative neurological ROS  negative psych ROS   GI/Hepatic Neg liver ROS,GERD  ,,  Endo/Other  negative endocrine ROS    Renal/GU negative Renal ROS  negative genitourinary   Musculoskeletal  (+) Arthritis , Osteoarthritis,    Abdominal  (+) + obese  Peds negative pediatric ROS (+)  Hematology negative hematology ROS (+)   Anesthesia Other Findings   Reproductive/Obstetrics negative OB ROS                             Anesthesia Physical Anesthesia Plan  ASA: 2  Anesthesia Plan: Spinal   Post-op Pain Management: Minimal or no pain anticipated   Induction: Intravenous  PONV Risk Score and Plan: 1 and Ondansetron and Treatment may vary due to age or medical condition  Airway Management Planned: Simple Face Mask  Additional Equipment:   Intra-op Plan:   Post-operative Plan:   Informed Consent: I have reviewed the patients History and Physical, chart, labs and discussed the procedure including the risks, benefits and alternatives for the proposed anesthesia with the patient or authorized representative who has indicated his/her understanding and acceptance.     Dental advisory given  Plan Discussed with: CRNA  Anesthesia Plan Comments: (Copy of EKG on chart dated 09/29/2022 shows normal sinus rhythm, rate 63.)        Anesthesia Quick  Evaluation

## 2022-10-21 ENCOUNTER — Other Ambulatory Visit: Payer: Self-pay | Admitting: Physician Assistant

## 2022-10-21 ENCOUNTER — Telehealth: Payer: Self-pay | Admitting: Orthopaedic Surgery

## 2022-10-21 MED ORDER — DOCUSATE SODIUM 100 MG PO CAPS: 100.0000 mg | ORAL_CAPSULE | Freq: Every day | ORAL | 2 refills | Status: AC | PRN

## 2022-10-21 MED ORDER — METHOCARBAMOL 750 MG PO TABS: 750.0000 mg | ORAL_TABLET | Freq: Two times a day (BID) | ORAL | 2 refills | Status: DC | PRN

## 2022-10-21 MED ORDER — OXYCODONE-ACETAMINOPHEN 5-325 MG PO TABS: 1.0000 | ORAL_TABLET | Freq: Four times a day (QID) | ORAL | 0 refills | Status: DC | PRN

## 2022-10-21 MED ORDER — ONDANSETRON HCL 4 MG PO TABS: 4.0000 mg | ORAL_TABLET | Freq: Three times a day (TID) | ORAL | 0 refills | Status: DC | PRN

## 2022-10-21 NOTE — Telephone Encounter (Signed)
Patient got 4 medications sent in yesterday to incorrect pharmacy they no longer accept his current insurance and needs them sent to [facility] on on [address] (Colace, methocarbamol, ondansetron and Oxycodone), please advise

## 2022-10-21 NOTE — Telephone Encounter (Signed)
resent

## 2022-10-23 MED ORDER — TRANEXAMIC ACID 1000 MG/10ML IV SOLN
2000.0000 mg | INTRAVENOUS | Status: DC
  Filled 2022-10-23 (×2): qty 20

## 2022-10-26 ENCOUNTER — Observation Stay (HOSPITAL_COMMUNITY): Payer: Managed Care, Other (non HMO)

## 2022-10-26 ENCOUNTER — Other Ambulatory Visit: Payer: Self-pay

## 2022-10-26 ENCOUNTER — Encounter (HOSPITAL_COMMUNITY): Payer: Self-pay | Admitting: Orthopaedic Surgery

## 2022-10-26 ENCOUNTER — Ambulatory Visit (HOSPITAL_COMMUNITY): Payer: Managed Care, Other (non HMO) | Admitting: Physician Assistant

## 2022-10-26 ENCOUNTER — Ambulatory Visit (HOSPITAL_BASED_OUTPATIENT_CLINIC_OR_DEPARTMENT_OTHER): Payer: Managed Care, Other (non HMO) | Admitting: Physician Assistant

## 2022-10-26 ENCOUNTER — Encounter (HOSPITAL_COMMUNITY)
Admission: RE | Disposition: A | Discharge status: Home Health | Payer: Self-pay | Source: Ambulatory Visit | Attending: Orthopaedic Surgery

## 2022-10-26 ENCOUNTER — Ambulatory Visit (HOSPITAL_COMMUNITY): Payer: Managed Care, Other (non HMO)

## 2022-10-26 ENCOUNTER — Observation Stay (HOSPITAL_COMMUNITY)
Admission: RE | Admit: 2022-10-26 | Discharge: 2022-10-27 | Disposition: A | Discharge status: Home Health | Payer: Managed Care, Other (non HMO) | Source: Ambulatory Visit | Attending: Orthopaedic Surgery | Admitting: Orthopaedic Surgery

## 2022-10-26 DIAGNOSIS — I10 Essential (primary) hypertension: Secondary | ICD-10-CM | POA: Insufficient documentation

## 2022-10-26 DIAGNOSIS — M25451 Effusion, right hip: Secondary | ICD-10-CM | POA: Diagnosis not present

## 2022-10-26 DIAGNOSIS — Z79899 Other long term (current) drug therapy: Secondary | ICD-10-CM | POA: Diagnosis not present

## 2022-10-26 DIAGNOSIS — M659 Synovitis and tenosynovitis, unspecified: Secondary | ICD-10-CM

## 2022-10-26 DIAGNOSIS — Z96641 Presence of right artificial hip joint: Secondary | ICD-10-CM

## 2022-10-26 DIAGNOSIS — Z86718 Personal history of other venous thrombosis and embolism: Secondary | ICD-10-CM | POA: Diagnosis not present

## 2022-10-26 DIAGNOSIS — M1611 Unilateral primary osteoarthritis, right hip: Secondary | ICD-10-CM | POA: Diagnosis not present

## 2022-10-26 HISTORY — PX: TOTAL HIP ARTHROPLASTY: SHX124

## 2022-10-26 LAB — ABO/RH: ABO/RH(D): A POS

## 2022-10-26 SURGERY — ARTHROPLASTY, HIP, TOTAL, ANTERIOR APPROACH
Anesthesia: Spinal | Site: Hip | Laterality: Right

## 2022-10-26 MED ORDER — PROMETHAZINE HCL 25 MG/ML IJ SOLN: 6.2500 mg | INTRAMUSCULAR | Status: DC | PRN

## 2022-10-26 MED ORDER — PHENYLEPHRINE HCL-NACL 20-0.9 MG/250ML-% IV SOLN
INTRAVENOUS | Status: DC | PRN
  Administered 2022-10-26: 20 ug/min via INTRAVENOUS

## 2022-10-26 MED ORDER — TRANEXAMIC ACID-NACL 1000-0.7 MG/100ML-% IV SOLN
1000.0000 mg | INTRAVENOUS | Status: AC
  Administered 2022-10-26: 1000 mg via INTRAVENOUS
  Filled 2022-10-26: qty 100

## 2022-10-26 MED ORDER — LISINOPRIL-HYDROCHLOROTHIAZIDE 10-12.5 MG PO TABS: 1.0000 | ORAL_TABLET | Freq: Every day | ORAL | Status: DC

## 2022-10-26 MED ORDER — LISINOPRIL 10 MG PO TABS
10.0000 mg | ORAL_TABLET | Freq: Every day | ORAL | Status: DC
  Administered 2022-10-27: 10 mg via ORAL
  Filled 2022-10-26: qty 1

## 2022-10-26 MED ORDER — ONDANSETRON HCL 4 MG/2ML IJ SOLN
INTRAMUSCULAR | Status: DC | PRN
  Administered 2022-10-26: 4 mg via INTRAVENOUS

## 2022-10-26 MED ORDER — ORAL CARE MOUTH RINSE: 15.0000 mL | Freq: Once | OROMUCOSAL | Status: AC

## 2022-10-26 MED ORDER — CEFAZOLIN SODIUM-DEXTROSE 2-4 GM/100ML-% IV SOLN
2.0000 g | INTRAVENOUS | Status: DC
  Filled 2022-10-26: qty 100

## 2022-10-26 MED ORDER — PRONTOSAN WOUND IRRIGATION OPTIME
TOPICAL | Status: DC | PRN
  Administered 2022-10-26: 1

## 2022-10-26 MED ORDER — METHOCARBAMOL 500 MG PO TABS
500.0000 mg | ORAL_TABLET | Freq: Four times a day (QID) | ORAL | Status: DC | PRN
  Administered 2022-10-26: 500 mg via ORAL
  Filled 2022-10-26: qty 1

## 2022-10-26 MED ORDER — CEFAZOLIN SODIUM-DEXTROSE 2-4 GM/100ML-% IV SOLN
2.0000 g | Freq: Four times a day (QID) | INTRAVENOUS | Status: AC
  Administered 2022-10-26 (×2): 2 g via INTRAVENOUS
  Filled 2022-10-26 (×2): qty 100

## 2022-10-26 MED ORDER — VANCOMYCIN HCL 1000 MG IV SOLR
INTRAVENOUS | Status: AC
  Filled 2022-10-26: qty 20

## 2022-10-26 MED ORDER — 0.9 % SODIUM CHLORIDE (POUR BTL) OPTIME
TOPICAL | Status: DC | PRN
  Administered 2022-10-26: 1000 mL

## 2022-10-26 MED ORDER — FERROUS SULFATE 325 (65 FE) MG PO TABS
325.0000 mg | ORAL_TABLET | Freq: Three times a day (TID) | ORAL | Status: DC
  Administered 2022-10-27: 325 mg via ORAL
  Filled 2022-10-26: qty 1

## 2022-10-26 MED ORDER — PROPOFOL 500 MG/50ML IV EMUL
INTRAVENOUS | Status: DC | PRN
  Administered 2022-10-26: 75 ug/kg/min via INTRAVENOUS

## 2022-10-26 MED ORDER — TRANEXAMIC ACID-NACL 1000-0.7 MG/100ML-% IV SOLN
1000.0000 mg | Freq: Once | INTRAVENOUS | Status: AC
  Administered 2022-10-26: 1000 mg via INTRAVENOUS
  Filled 2022-10-26: qty 100

## 2022-10-26 MED ORDER — ACETAMINOPHEN 325 MG PO TABS: 325.0000 mg | ORAL_TABLET | Freq: Four times a day (QID) | ORAL | Status: DC | PRN

## 2022-10-26 MED ORDER — DEXAMETHASONE SODIUM PHOSPHATE 10 MG/ML IJ SOLN
10.0000 mg | Freq: Once | INTRAMUSCULAR | Status: AC
  Administered 2022-10-27: 10 mg via INTRAVENOUS
  Filled 2022-10-26: qty 1

## 2022-10-26 MED ORDER — PANTOPRAZOLE SODIUM 40 MG PO TBEC
40.0000 mg | DELAYED_RELEASE_TABLET | Freq: Every day | ORAL | Status: DC
  Administered 2022-10-26 – 2022-10-27 (×2): 40 mg via ORAL
  Filled 2022-10-26 (×2): qty 1

## 2022-10-26 MED ORDER — VANCOMYCIN HCL 1 G IV SOLR
INTRAVENOUS | Status: DC | PRN
  Administered 2022-10-26: 1000 mg via TOPICAL

## 2022-10-26 MED ORDER — SODIUM CHLORIDE 0.9 % IR SOLN
Status: DC | PRN
  Administered 2022-10-26: 1000 mL

## 2022-10-26 MED ORDER — POVIDONE-IODINE 10 % EX SWAB
2.0000 | Freq: Once | CUTANEOUS | Status: AC
  Administered 2022-10-26: 2 via TOPICAL

## 2022-10-26 MED ORDER — SERTRALINE HCL 50 MG PO TABS
50.0000 mg | ORAL_TABLET | Freq: Every day | ORAL | Status: DC
  Administered 2022-10-27: 50 mg via ORAL
  Filled 2022-10-26: qty 1

## 2022-10-26 MED ORDER — BUPIVACAINE-MELOXICAM ER 400-12 MG/14ML IJ SOLN
INTRAMUSCULAR | Status: AC
  Filled 2022-10-26: qty 1

## 2022-10-26 MED ORDER — PROPOFOL 10 MG/ML IV BOLUS
INTRAVENOUS | Status: AC
  Filled 2022-10-26: qty 20

## 2022-10-26 MED ORDER — CHLORHEXIDINE GLUCONATE 0.12 % MT SOLN
15.0000 mL | Freq: Once | OROMUCOSAL | Status: AC
  Administered 2022-10-26: 15 mL via OROMUCOSAL
  Filled 2022-10-26: qty 15

## 2022-10-26 MED ORDER — POLYETHYLENE GLYCOL 3350 17 G PO PACK
17.0000 g | PACK | Freq: Every day | ORAL | Status: DC
  Administered 2022-10-26 – 2022-10-27 (×2): 17 g via ORAL
  Filled 2022-10-26 (×2): qty 1

## 2022-10-26 MED ORDER — ONDANSETRON HCL 4 MG/2ML IJ SOLN: 4.0000 mg | Freq: Four times a day (QID) | INTRAMUSCULAR | Status: DC | PRN

## 2022-10-26 MED ORDER — METOCLOPRAMIDE HCL 5 MG PO TABS: 5.0000 mg | ORAL_TABLET | Freq: Three times a day (TID) | ORAL | Status: DC | PRN

## 2022-10-26 MED ORDER — OXYCODONE HCL 5 MG PO TABS
5.0000 mg | ORAL_TABLET | ORAL | Status: DC | PRN
  Administered 2022-10-26 – 2022-10-27 (×2): 10 mg via ORAL
  Filled 2022-10-26: qty 2

## 2022-10-26 MED ORDER — OXYCODONE HCL 5 MG PO TABS
10.0000 mg | ORAL_TABLET | ORAL | Status: DC | PRN
  Filled 2022-10-26: qty 2

## 2022-10-26 MED ORDER — PROPOFOL 10 MG/ML IV BOLUS
INTRAVENOUS | Status: DC | PRN
  Administered 2022-10-26 (×2): 20 mg via INTRAVENOUS

## 2022-10-26 MED ORDER — ALUM & MAG HYDROXIDE-SIMETH 200-200-20 MG/5ML PO SUSP: 30.0000 mL | ORAL | Status: DC | PRN

## 2022-10-26 MED ORDER — HYDROMORPHONE HCL 1 MG/ML IJ SOLN: 0.2500 mg | INTRAMUSCULAR | Status: DC | PRN

## 2022-10-26 MED ORDER — TAMSULOSIN HCL 0.4 MG PO CAPS
0.4000 mg | ORAL_CAPSULE | Freq: Every day | ORAL | Status: DC
  Administered 2022-10-27: 0.4 mg via ORAL
  Filled 2022-10-26: qty 1

## 2022-10-26 MED ORDER — MIDAZOLAM HCL 2 MG/2ML IJ SOLN
INTRAMUSCULAR | Status: AC
  Filled 2022-10-26: qty 2

## 2022-10-26 MED ORDER — DIPHENHYDRAMINE HCL 12.5 MG/5ML PO ELIX: 25.0000 mg | ORAL_SOLUTION | ORAL | Status: DC | PRN

## 2022-10-26 MED ORDER — OXYCODONE HCL 5 MG PO TABS: 5.0000 mg | ORAL_TABLET | Freq: Once | ORAL | Status: DC | PRN

## 2022-10-26 MED ORDER — ASPIRIN 81 MG PO CHEW
81.0000 mg | CHEWABLE_TABLET | Freq: Once | ORAL | Status: AC
  Administered 2022-10-26: 81 mg via ORAL
  Filled 2022-10-26: qty 1

## 2022-10-26 MED ORDER — PHENOL 1.4 % MT LIQD: 1.0000 | OROMUCOSAL | Status: DC | PRN

## 2022-10-26 MED ORDER — BUPIVACAINE-MELOXICAM ER 400-12 MG/14ML IJ SOLN
INTRAMUSCULAR | Status: DC | PRN
  Administered 2022-10-26: 400 mg

## 2022-10-26 MED ORDER — RIVAROXABAN 20 MG PO TABS
20.0000 mg | ORAL_TABLET | Freq: Every day | ORAL | Status: DC
  Administered 2022-10-27: 20 mg via ORAL
  Filled 2022-10-26: qty 1

## 2022-10-26 MED ORDER — HYDROCHLOROTHIAZIDE 12.5 MG PO TABS
12.5000 mg | ORAL_TABLET | Freq: Every day | ORAL | Status: DC
  Administered 2022-10-27: 12.5 mg via ORAL
  Filled 2022-10-26: qty 1

## 2022-10-26 MED ORDER — CEFAZOLIN SODIUM-DEXTROSE 2-3 GM-%(50ML) IV SOLR
INTRAVENOUS | Status: DC | PRN
  Administered 2022-10-26: 2 g via INTRAVENOUS

## 2022-10-26 MED ORDER — SORBITOL 70 % SOLN: 30.0000 mL | Freq: Every day | Status: DC | PRN

## 2022-10-26 MED ORDER — DOCUSATE SODIUM 100 MG PO CAPS
100.0000 mg | ORAL_CAPSULE | Freq: Two times a day (BID) | ORAL | Status: DC
  Administered 2022-10-26 – 2022-10-27 (×3): 100 mg via ORAL
  Filled 2022-10-26 (×3): qty 1

## 2022-10-26 MED ORDER — MENTHOL 3 MG MT LOZG: 1.0000 | LOZENGE | OROMUCOSAL | Status: DC | PRN

## 2022-10-26 MED ORDER — OXYCODONE HCL ER 10 MG PO T12A
10.0000 mg | EXTENDED_RELEASE_TABLET | Freq: Two times a day (BID) | ORAL | Status: DC
  Administered 2022-10-26 – 2022-10-27 (×3): 10 mg via ORAL
  Filled 2022-10-26 (×3): qty 1

## 2022-10-26 MED ORDER — ONDANSETRON HCL 4 MG PO TABS: 4.0000 mg | ORAL_TABLET | Freq: Four times a day (QID) | ORAL | Status: DC | PRN

## 2022-10-26 MED ORDER — ACETAMINOPHEN 500 MG PO TABS
1000.0000 mg | ORAL_TABLET | Freq: Four times a day (QID) | ORAL | Status: AC
  Administered 2022-10-26 – 2022-10-27 (×4): 1000 mg via ORAL
  Filled 2022-10-26 (×4): qty 2

## 2022-10-26 MED ORDER — FENTANYL CITRATE (PF) 250 MCG/5ML IJ SOLN
INTRAMUSCULAR | Status: DC | PRN
  Administered 2022-10-26 (×2): 50 ug via INTRAVENOUS

## 2022-10-26 MED ORDER — MAGNESIUM CITRATE PO SOLN: 1.0000 | Freq: Once | ORAL | Status: DC | PRN

## 2022-10-26 MED ORDER — METOCLOPRAMIDE HCL 5 MG/ML IJ SOLN: 5.0000 mg | Freq: Three times a day (TID) | INTRAMUSCULAR | Status: DC | PRN

## 2022-10-26 MED ORDER — LACTATED RINGERS IV SOLN: INTRAVENOUS | Status: DC

## 2022-10-26 MED ORDER — OXYCODONE HCL 5 MG/5ML PO SOLN: 5.0000 mg | Freq: Once | ORAL | Status: DC | PRN

## 2022-10-26 MED ORDER — FENTANYL CITRATE (PF) 250 MCG/5ML IJ SOLN
INTRAMUSCULAR | Status: AC
  Filled 2022-10-26: qty 5

## 2022-10-26 MED ORDER — MIDAZOLAM HCL 2 MG/2ML IJ SOLN
INTRAMUSCULAR | Status: DC | PRN
  Administered 2022-10-26: 2 mg via INTRAVENOUS

## 2022-10-26 MED ORDER — METHOCARBAMOL 1000 MG/10ML IJ SOLN: 500.0000 mg | Freq: Four times a day (QID) | INTRAVENOUS | Status: DC | PRN

## 2022-10-26 MED ORDER — SODIUM CHLORIDE 0.9 % IV SOLN: INTRAVENOUS | Status: DC

## 2022-10-26 MED ORDER — HYDROMORPHONE HCL 1 MG/ML IJ SOLN: 0.5000 mg | INTRAMUSCULAR | Status: DC | PRN

## 2022-10-26 MED ORDER — BUPIVACAINE IN DEXTROSE 0.75-8.25 % IT SOLN
INTRATHECAL | Status: DC | PRN
  Administered 2022-10-26: 2 mL via INTRATHECAL

## 2022-10-26 SURGICAL SUPPLY — 68 items
ADH SKN CLS APL DERMABOND .7 (GAUZE/BANDAGES/DRESSINGS) ×1
BAG COUNTER SPONGE SURGICOUNT (BAG) ×1 IMPLANT
BAG DECANTER FOR FLEXI CONT (MISCELLANEOUS) ×1 IMPLANT
BAG SPNG CNTER NS LX DISP (BAG) ×1
BLADE SAG 18X100X1.27 (BLADE) ×1 IMPLANT
COOLER ICEMAN CLASSIC (MISCELLANEOUS) IMPLANT
COVER PERINEAL POST (MISCELLANEOUS) ×1 IMPLANT
COVER SURGICAL LIGHT HANDLE (MISCELLANEOUS) ×1 IMPLANT
CUP ACET PNNCL SECTR W/GRIP 56 (Hips) IMPLANT
DERMABOND ADVANCED .7 DNX12 (GAUZE/BANDAGES/DRESSINGS) IMPLANT
DRAPE C-ARM 42X72 X-RAY (DRAPES) ×1 IMPLANT
DRAPE POUCH INSTRU U-SHP 10X18 (DRAPES) ×1 IMPLANT
DRAPE STERI IOBAN 125X83 (DRAPES) ×1 IMPLANT
DRAPE U-SHAPE 47X51 STRL (DRAPES) ×2 IMPLANT
DRSG AQUACEL AG ADV 3.5X10 (GAUZE/BANDAGES/DRESSINGS) ×1 IMPLANT
DURAPREP 26ML APPLICATOR (WOUND CARE) ×2 IMPLANT
ELECT BLADE 4.0 EZ CLEAN MEGAD (MISCELLANEOUS) ×1
ELECT REM PT RETURN 9FT ADLT (ELECTROSURGICAL) ×1
ELECTRODE BLDE 4.0 EZ CLN MEGD (MISCELLANEOUS) ×1 IMPLANT
ELECTRODE REM PT RTRN 9FT ADLT (ELECTROSURGICAL) ×1 IMPLANT
GLOVE BIOGEL PI IND STRL 7.0 (GLOVE) ×2 IMPLANT
GLOVE BIOGEL PI IND STRL 7.5 (GLOVE) ×5 IMPLANT
GLOVE ECLIPSE 7.0 STRL STRAW (GLOVE) ×2 IMPLANT
GLOVE SKINSENSE STRL SZ7.5 (GLOVE) ×1 IMPLANT
GLOVE SURG SYN 7.5 E (GLOVE) ×2 IMPLANT
GLOVE SURG SYN 7.5 PF PI (GLOVE) ×2 IMPLANT
GLOVE SURG UNDER POLY LF SZ7 (GLOVE) ×3 IMPLANT
GLOVE SURG UNDER POLY LF SZ7.5 (GLOVE) ×2 IMPLANT
GOWN STRL REUS W/ TWL LRG LVL3 (GOWN DISPOSABLE) IMPLANT
GOWN STRL REUS W/ TWL XL LVL3 (GOWN DISPOSABLE) ×1 IMPLANT
GOWN STRL REUS W/TWL LRG LVL3 (GOWN DISPOSABLE) ×1
GOWN STRL REUS W/TWL XL LVL3 (GOWN DISPOSABLE) ×1
GOWN STRL SURGICAL XL XLNG (GOWN DISPOSABLE) ×1 IMPLANT
GOWN TOGA ZIPPER T7+ PEEL AWAY (MISCELLANEOUS) ×2 IMPLANT
HANDPIECE INTERPULSE COAX TIP (DISPOSABLE) ×1
HEAD CERAMIC BIOLOX 32 +1 (Hips) IMPLANT
HOOD PEEL AWAY T7 (MISCELLANEOUS) ×1 IMPLANT
IV NS IRRIG 3000ML ARTHROMATIC (IV SOLUTION) ×1 IMPLANT
KIT BASIN OR (CUSTOM PROCEDURE TRAY) ×1 IMPLANT
LINER NEUT HIP ALTRX 40 56 +4 (Liner) ×1 IMPLANT
LINER NEUTRAL HIP ALTRX 40 56 (Liner) IMPLANT
MARKER SKIN DUAL TIP RULER LAB (MISCELLANEOUS) ×1 IMPLANT
NDL SPNL 18GX3.5 QUINCKE PK (NEEDLE) ×1 IMPLANT
NEEDLE SPNL 18GX3.5 QUINCKE PK (NEEDLE) ×1 IMPLANT
PACK TOTAL JOINT (CUSTOM PROCEDURE TRAY) ×1 IMPLANT
PACK UNIVERSAL I (CUSTOM PROCEDURE TRAY) ×1 IMPLANT
PAD COLD SHLDR WRAP-ON (PAD) IMPLANT
PINN SECTOR W/GRIP ACE CUP 56 (Hips) ×1 IMPLANT
SCREW 6.5MMX25MM (Screw) IMPLANT
SET HNDPC FAN SPRY TIP SCT (DISPOSABLE) ×1 IMPLANT
SOLUTION PRONTOSAN WOUND 350ML (IRRIGATION / IRRIGATOR) ×1 IMPLANT
STAPLER VISISTAT 35W (STAPLE) IMPLANT
STEM FEMORAL SZ6 HIGH ACTIS (Stem) IMPLANT
SUT ETHIBOND 2 V 37 (SUTURE) ×1 IMPLANT
SUT ETHILON 2 0 FS 18 (SUTURE) IMPLANT
SUT VIC AB 0 CT1 27 (SUTURE) ×1
SUT VIC AB 0 CT1 27XBRD ANBCTR (SUTURE) ×1 IMPLANT
SUT VIC AB 1 CTX 36 (SUTURE) ×1
SUT VIC AB 1 CTX36XBRD ANBCTR (SUTURE) ×1 IMPLANT
SUT VIC AB 2-0 CT1 27 (SUTURE) ×2
SUT VIC AB 2-0 CT1 TAPERPNT 27 (SUTURE) ×2 IMPLANT
SYR 50ML LL SCALE MARK (SYRINGE) ×1 IMPLANT
TOWEL GREEN STERILE (TOWEL DISPOSABLE) ×1 IMPLANT
TRAY CATH INTERMITTENT SS 16FR (CATHETERS) IMPLANT
TRAY FOLEY W/BAG SLVR 16FR (SET/KITS/TRAYS/PACK) ×1
TRAY FOLEY W/BAG SLVR 16FR ST (SET/KITS/TRAYS/PACK) IMPLANT
TUBE SUCT ARGYLE STRL (TUBING) ×1 IMPLANT
YANKAUER SUCT BULB TIP NO VENT (SUCTIONS) ×1 IMPLANT

## 2022-10-26 NOTE — H&P (Signed)
PREOPERATIVE H&P  Chief Complaint: right hip osteoarthritis  HPI: [name] [name] is a 57 y.o. male who presents for surgical treatment of right hip osteoarthritis.  He denies any changes in medical history.  Past Medical History:  Diagnosis Date   Anxiety    Arthritis    DVT (deep venous thrombosis) (HCC)    GERD (gastroesophageal reflux disease)    History of hiatal hernia    Hyperlipidemia    Hypertension    Past Surgical History:  Procedure Laterality Date   COLONOSCOPY  2016   INGUINAL HERNIA REPAIR Right 2021   UMBILICAL HERNIA REPAIR     Social History   Socioeconomic History   Marital status: Married    Spouse name: Not on file   Number of children: Not on file   Years of education: Not on file   Highest education level: Not on file  Occupational History   Not on file  Tobacco Use   Smoking status: Never   Smokeless tobacco: Never  Vaping Use   Vaping Use: Never used  Substance and Sexual Activity   Alcohol use: Yes    Alcohol/week: 4.0 - 5.0 standard drinks of alcohol    Types: 4 - 5 Standard drinks or equivalent per week   Drug use: No   Sexual activity: Yes  Other Topics Concern   Not on file  Social History Narrative   Not on file   Social Determinants of Health   Financial Resource Strain: Not on file  Food Insecurity: Not on file  Transportation Needs: Not on file  Physical Activity: Not on file  Stress: Not on file  Social Connections: Not on file   Family History  Problem Relation Age of Onset   Stroke Father    No Known Allergies Prior to Admission medications   Medication Sig Start Date End Date Taking? Authorizing Provider  atorvastatin (LIPITOR) 40 MG tablet Take 40 mg by mouth daily.   Yes [provider]  B Complex Vitamins (VITAMIN B COMPLEX PO) Take 1 tablet by mouth daily.   Yes [provider]  Cholecalciferol 125 MCG (5000 UT) capsule Take 5,000 Units by mouth daily.   Yes [provider]   lisinopril-hydrochlorothiazide (ZESTORETIC) 10-12.5 MG tablet Take 1 tablet by mouth daily.   Yes [provider]  Multiple Vitamin (MULTIVITAMIN WITH MINERALS) TABS tablet Take 1 tablet by mouth daily.   Yes [provider]  NON FORMULARY Pt uses a cpap nightly   Yes [provider]  sertraline (ZOLOFT) 50 MG tablet Take 50 mg by mouth daily.   Yes [provider]  tamsulosin (FLOMAX) 0.4 MG CAPS capsule Take 0.4 mg by mouth daily. 12/21/16  Yes [provider]  XARELTO 20 MG TABS tablet Take 20 mg by mouth daily with supper.   Yes [provider]  docusate sodium (COLACE) 100 MG capsule Take 1 capsule (100 mg total) by mouth daily as needed. 10/21/22 10/21/23  Cristie Hem, PA-C  methocarbamol (ROBAXIN-750) 750 MG tablet Take 1 tablet (750 mg total) by mouth 2 (two) times daily as needed for muscle spasms. 10/21/22   Cristie Hem, PA-C  ondansetron (ZOFRAN) 4 MG tablet Take 1 tablet (4 mg total) by mouth every 8 (eight) hours as needed for nausea or vomiting. 10/21/22   Cristie Hem, PA-C  oxyCODONE-acetaminophen (PERCOCET) 5-325 MG tablet Take 1-2 tablets by mouth every 6 (six) hours as needed. To be taken after surgery  10/21/22   Cristie Hem, PA-C     Positive ROS: All other systems have been reviewed and were otherwise negative with the exception of those mentioned in the HPI and as above.  Physical Exam: General: Alert, no acute distress Cardiovascular: No pedal edema Respiratory: No cyanosis, no use of accessory musculature GI: abdomen soft Skin: No lesions in the area of chief complaint Neurologic: Sensation intact distally Psychiatric: Patient is competent for consent with normal mood and affect Lymphatic: no lymphedema  MUSCULOSKELETAL: exam stable  Assessment: right hip osteoarthritis  Plan: Plan for Procedure(s): RIGHT TOTAL HIP ARTHROPLASTY ANTERIOR APPROACH  The risks benefits and alternatives were discussed  with the patient including but not limited to the risks of nonoperative treatment, versus surgical intervention including infection, bleeding, nerve injury,  blood clots, cardiopulmonary complications, morbidity, mortality, among others, and they were willing to proceed.   [name], MD 10/26/2022 7:14 AM

## 2022-10-26 NOTE — Transfer of Care (Signed)
Immediate Anesthesia Transfer of Care Note  Patient: [name] [name]  Procedure(s) Performed: RIGHT TOTAL HIP ARTHROPLASTY ANTERIOR APPROACH (Right: Hip)  Patient Location: PACU  Anesthesia Type:MAC and Spinal  Level of Consciousness: awake and alert   Airway & Oxygen Therapy: Patient Spontanous Breathing and Patient connected to face mask oxygen  Post-op Assessment: Report given to RN and Post -op Vital signs reviewed and stable  Post vital signs: Reviewed and stable  Last Vitals:  Vitals Value Taken Time  BP 133/78 10/26/22 1100  Temp    Pulse 64 10/26/22 1102  Resp 14 10/26/22 1102  SpO2 97 % 10/26/22 1102  Vitals shown include unvalidated device data.  Last Pain:  Vitals:   10/26/22 0709  TempSrc: Oral  PainSc:          Complications: No notable events documented.

## 2022-10-26 NOTE — Evaluation (Signed)
Physical Therapy Evaluation Patient Details Name: [name] [name] MRN: [id number] DOB: 01/31/1966 Today's Date: 10/26/2022  History of Present Illness  Pt is 57 year old presented to [facility] on  10/26/22 for rt THR. PMH - arthritis, anxiety, DVT, htn  Clinical Impression  Pt admitted with above diagnosis and presents to PT with functional limitations due to deficits listed below (See PT problem list). Pt needs skilled PT to maximize independence and safety. Pt doing very well post op day #0. Expect he will continue to progress and plans to return home with wife.          Recommendations for follow up therapy are one component of a multi-disciplinary discharge planning process, led by the attending physician.  Recommendations may be updated based on patient status, additional functional criteria and insurance authorization.  Follow Up Recommendations       Assistance Recommended at Discharge PRN  Patient can return home with the following  Help with stairs or ramp for entrance;Assist for transportation;Assistance with cooking/housework    Equipment Recommendations Rolling walker (2 wheels)  Recommendations for Other Services       Functional Status Assessment Patient has had a recent decline in their functional status and demonstrates the ability to make significant improvements in function in a reasonable and predictable amount of time.     Precautions / Restrictions Precautions Precautions: None Restrictions Weight Bearing Restrictions: Yes RLE Weight Bearing: Weight bearing as tolerated      Mobility  Bed Mobility Overal bed mobility: Needs Assistance Bed Mobility: Supine to Sit     Supine to sit: Min assist     General bed mobility comments: Assist to move RLE off of bed    Transfers Overall transfer level: Needs assistance Equipment used: Rolling walker (2 wheels) Transfers: Sit to/from Stand Sit to Stand: Min guard           General transfer comment: Verbal  cues for hand placement. Incr time to rise    Ambulation/Gait Ambulation/Gait assistance: Supervision Gait Distance (Feet): 150 Feet Assistive device: Rolling walker (2 wheels) Gait Pattern/deviations: Step-through pattern, Decreased stride length Gait velocity: decr Gait velocity interpretation: 1.31 - 2.62 ft/sec, indicative of limited community ambulator   General Gait Details: Assist for safety and lines. Initial instruction in gait sequence and use of walker  Stairs            Wheelchair Mobility    Modified Rankin (Stroke Patients Only)       Balance Overall balance assessment: No apparent balance deficits (not formally assessed)                                           Pertinent Vitals/Pain Pain Assessment Pain Assessment: 0-10 Pain Score: 6  Pain Location: rt hip Pain Descriptors / Indicators: Sore Pain Intervention(s): Limited activity within patient's tolerance, Monitored during session, Premedicated before session, Repositioned    Home Living Family/patient expects to be discharged to:: Private residence Living Arrangements: Spouse/significant other Available Help at Discharge: Family;Available 24 hours/day Type of Home: House Home Access: Stairs to enter Entrance Stairs-Rails: Right;Left;Can reach both Entrance Stairs-Number of Steps: 2 Alternate Level Stairs-Number of Steps: flight Home Layout: Two level Home Equipment: Cane - single point      Prior Function Prior Level of Function : Independent/Modified Independent;Working/employed;Driving             Mobility Comments:  No assistive device       Hand Dominance        Extremity/Trunk Assessment   Upper Extremity Assessment Upper Extremity Assessment: Overall WFL for tasks assessed    Lower Extremity Assessment Lower Extremity Assessment: RLE deficits/detail RLE Deficits / Details: Limited by post op pain       Communication   Communication: No  difficulties  Cognition Arousal/Alertness: Awake/alert Behavior During Therapy: WFL for tasks assessed/performed Overall Cognitive Status: Within Functional Limits for tasks assessed                                          General Comments      Exercises Total Joint Exercises Ankle Circles/Pumps: AROM, Both, 5 reps, Supine Quad Sets: Strengthening, Right, 5 reps, Supine Heel Slides: AAROM, Right, Supine (3 reps) Hip ABduction/ADduction: AAROM, Right, Supine (3 reps)   Assessment/Plan    PT Assessment Patient needs continued PT services  PT Problem List Decreased strength;Decreased mobility;Pain       PT Treatment Interventions DME instruction;Gait training;Stair training;Functional mobility training;Therapeutic activities;Therapeutic exercise;Patient/family education    PT Goals (Current goals can be found in the Care Plan section)  Acute Rehab PT Goals Patient Stated Goal: return to walking 6-7 miles PT Goal Formulation: With patient Time For Goal Achievement: 10/30/22 Potential to Achieve Goals: Good    Frequency 7X/week     Co-evaluation               AM-PAC PT "6 Clicks" Mobility  Outcome Measure Help needed turning from your back to your side while in a flat bed without using bedrails?: A Little Help needed moving from lying on your back to sitting on the side of a flat bed without using bedrails?: A Little Help needed moving to and from a bed to a chair (including a wheelchair)?: A Little Help needed standing up from a chair using your arms (e.g., wheelchair or bedside chair)?: A Little Help needed to walk in hospital room?: A Little Help needed climbing 3-5 steps with a railing? : A Little 6 Click Score: 18    End of Session Equipment Utilized During Treatment: Gait belt Activity Tolerance: Patient tolerated treatment well Patient left: in chair;with call bell/phone within reach;with family/visitor present Nurse Communication:  Mobility status PT Visit Diagnosis: Other abnormalities of gait and mobility (R26.89);Pain Pain - Right/Left: Right Pain - part of body: Hip    Time: 1610-9604 PT Time Calculation (min) (ACUTE ONLY): 33 min   Charges:   PT Evaluation $PT Eval Low Complexity: 1 Low PT Treatments $Gait Training: 8-22 mins        [facility] PT Acute Rehabilitation Services Office [phone]   Angelina Ok [facility] 10/26/2022, 6:13 PM

## 2022-10-26 NOTE — Op Note (Signed)
RIGHT TOTAL HIP ARTHROPLASTY ANTERIOR APPROACH  Procedure Note [name] [name]   [id number]  Pre-op Diagnosis: right hip osteoarthritis     Post-op Diagnosis: same  Operative Findings Complete loss of joint space and articular cartilage Joint effusion, synovitis   Operative Procedures  1. Total hip replacement; Right hip; uncemented [id number]   Surgeon: Gershon Mussel, M.D.  Assist: Oneal Grout, PA-C   Anesthesia: spinal  Prosthesis: Depuy Acetabulum: Pinnacle 56 mm Femur: Actis 6 HO Head: 40 mm size: +1.5 Liner: +4 Bearing Type: ceramic/poly  Total Hip Arthroplasty (Anterior Approach) Op Note:  After informed consent was obtained and the operative extremity marked in the holding area, the patient was brought back to the operating room and placed supine on the HANA table. Next, the operative extremity was prepped and draped in normal sterile fashion. Surgical timeout occurred verifying patient identification, surgical site, surgical procedure and administration of antibiotics.  A Hueter approach to the hip was performed, using the interval between tensor fascia lata and sartorius.  Dissection was carried bluntly down onto the anterior hip capsule. The lateral femoral circumflex vessels were identified and coagulated. A capsulotomy was performed and the capsular flaps tagged for later repair.  The neck osteotomy was performed. The femoral head was removed which showed severe wear and complete loss of cartilage, the acetabular rim was cleared of soft tissue and osteophytes and attention was turned to reaming the acetabulum.  Sequential reaming was performed under fluoroscopic guidance down to the floor of the cotyloid fossa. We reamed to a size 55 mm, and then impacted the acetabular shell. A 25 mm cancellous screw was placed through the shell for added fixation.  The liner was then placed after irrigation and attention turned to the femur.  After placing the femoral hook, the  leg was taken to externally rotated, extended and adducted position taking care to perform soft tissue releases to allow for adequate mobilization of the femur. Soft tissue was cleared from the shoulder of the greater trochanter and the hook elevator used to improve exposure of the proximal femur. Sequential broaching performed up to a size 6. Trial neck and head were placed. The leg was brought back up to neutral and the construct reduced.  Antibiotic irrigation was placed in the surgical wound.  The position and sizing of components, offset and leg lengths were checked using fluoroscopy. Stability of the construct was checked in extension and external rotation without any subluxation, shuck or impingement of prosthesis. We dislocated the prosthesis, dropped the leg back into position, removed trial components, and irrigated copiously. The final stem and head was then placed, the leg brought back up, the system reduced and fluoroscopy used to verify positioning.  We irrigated, obtained hemostasis and closed the capsule using #2 ethibond suture.  One gram of vancomycin powder was placed in the surgical bed.   One gram of topical tranexamic acid was injected into the joint.  The fascia was closed with #1 vicryl plus, the deep fat layer was closed with 0 vicryl, the subcutaneous layers closed with 2.0 Vicryl Plus and the skin closed with 2.0 nylon and dermabond. A sterile dressing was applied. The patient was awakened in the operating room and taken to recovery in stable condition.  All sponge, needle, and instrument counts were correct at the end of the case.   Tessa Lerner, my PA, was a medical necessity for opening, closing, limb positioning, retracting, exposing, and overall facilitation and timely completion of the surgery.  Position: supine  Complications: see description of procedure.  Time Out: performed   Drains/Packing: none  Estimated blood loss: see anesthesia record  Returned to Recovery  Room: in good condition.   Antibiotics: yes   Mechanical VTE (DVT) Prophylaxis: sequential compression devices, TED thigh-high  Chemical VTE (DVT) Prophylaxis: xarelto POD 1   Fluid Replacement: see anesthesia record  Specimens Removed: 1 to pathology   Sponge and Instrument Count Correct? yes   PACU: portable radiograph - low AP   Plan/RTC: Return in 2 weeks for staple removal. Weight Bearing/Load Lower Extremity: full  Hip precautions: none Suture Removal: 2 weeks   N. [name], MD [facility] 10:28 AM   Implant Name Type Inv. Item Serial No. Manufacturer Lot No. LRB No. Used Action  PINN SECTOR W/GRIP ACE CUP 56 - [id number] Hips PINN SECTOR W/GRIP ACE CUP 56  [employer] [id number] Right 1 Implanted  SCREW 6.5MMX25MM - [id number] Screw SCREW 6.5MMX25MM  [employer] [id number] Right 1 Implanted  LINER NEUT HIP ALTRX 40 56 +4 - [id number] Liner LINER NEUT HIP ALTRX 40 56 +4  [employer] [id number] Right 1 Implanted  STEM FEMORAL SZ6 HIGH ACTIS - [id number] Stem STEM FEMORAL SZ6 HIGH ACTIS  [employer] [id number] Right 1 Implanted  HEAD CERAMIC BIOLOX 32 +1 - [id number] Hips HEAD CERAMIC BIOLOX 32 +1  [employer] [id number] Right 1 Implanted

## 2022-10-26 NOTE — Anesthesia Procedure Notes (Signed)
Procedure Name: MAC Date/Time: 10/26/2022 9:59 AM  Performed by: Caren Macadam, CRNAPre-anesthesia Checklist: Patient identified, Emergency Drugs available, Suction available, Patient being monitored and Timeout performed Patient Re-evaluated:Patient Re-evaluated prior to induction Oxygen Delivery Method: Simple face mask Preoxygenation: Pre-oxygenation with 100% oxygen Induction Type: IV induction

## 2022-10-26 NOTE — Anesthesia Procedure Notes (Signed)
Spinal  Patient location during procedure: OR Start time: 10/26/2022 8:54 AM End time: 10/26/2022 8:59 AM Reason for block: surgical anesthesia Staffing Performed: anesthesiologist  Anesthesiologist: Lowella Curb, MD Performed by: Lowella Curb, MD Authorized by: Lowella Curb, MD   Preanesthetic Checklist Completed: patient identified, IV checked, site marked, risks and benefits discussed, surgical consent, monitors and equipment checked, pre-op evaluation and timeout performed Spinal Block Patient position: sitting Prep: DuraPrep Patient monitoring: heart rate, cardiac monitor, continuous pulse ox and blood pressure Approach: midline Location: L3-4 Injection technique: single-shot Needle Needle type: Sprotte  Needle gauge: 24 G Needle length: 9 cm Assessment Sensory level: T4 Events: CSF return

## 2022-10-26 NOTE — Discharge Instructions (Signed)

## 2022-10-26 NOTE — Plan of Care (Signed)
  Problem: Pain Management: Goal: Pain level will decrease with appropriate interventions Outcome: Progressing   Problem: Health Behavior/Discharge Planning: Goal: Ability to manage health-related needs will improve Outcome: Progressing   Problem: Clinical Measurements: Goal: Will remain free from infection Outcome: Progressing   Problem: Activity: Goal: Risk for activity intolerance will decrease Outcome: Progressing   Problem: Pain Managment: Goal: General experience of comfort will improve Outcome: Progressing   Problem: Safety: Goal: Ability to remain free from injury will improve Outcome: Progressing

## 2022-10-26 NOTE — Anesthesia Postprocedure Evaluation (Signed)
Anesthesia Post Note  Patient: [name] [name]  Procedure(s) Performed: RIGHT TOTAL HIP ARTHROPLASTY ANTERIOR APPROACH (Right: Hip)     Patient location during evaluation: PACU Anesthesia Type: Spinal Level of consciousness: awake and alert Pain management: pain level controlled Vital Signs Assessment: post-procedure vital signs reviewed and stable Respiratory status: spontaneous breathing, nonlabored ventilation and respiratory function stable Cardiovascular status: blood pressure returned to baseline and stable Postop Assessment: no apparent nausea or vomiting Anesthetic complications: no   No notable events documented.  Last Vitals:  Vitals:   10/26/22 1245 10/26/22 1259  BP: (!) 155/97 129/80  Pulse: (!) 56 (!) 59  Resp: 12 14  Temp: 36.6 C (!) 36.4 C  SpO2: 96% 99%    Last Pain:  Vitals:   10/26/22 1259  TempSrc: Oral  PainSc: 3                  Lowella Curb

## 2022-10-27 DIAGNOSIS — M1611 Unilateral primary osteoarthritis, right hip: Secondary | ICD-10-CM | POA: Diagnosis not present

## 2022-10-27 LAB — CBC
HCT: 38.5 % — ABNORMAL LOW (ref 39.0–52.0)
Hemoglobin: 13 g/dL (ref 13.0–17.0)
MCH: 32.1 pg (ref 26.0–34.0)
MCHC: 33.8 g/dL (ref 30.0–36.0)
MCV: 95.1 fL (ref 80.0–100.0)
Platelets: 186 10*3/uL (ref 150–400)
RBC: 4.05 MIL/uL — ABNORMAL LOW (ref 4.22–5.81)
RDW: 12.3 % (ref 11.5–15.5)
WBC: 10.1 10*3/uL (ref 4.0–10.5)
nRBC: 0 % (ref 0.0–0.2)

## 2022-10-27 NOTE — Progress Notes (Signed)
Physical Therapy Treatment Patient Details Name: [name] [name] MRN: [id number] DOB: 03-06-1966 Today's Date: 10/27/2022   History of Present Illness Pt is 57 year old presented to [facility] on  10/26/22 for rt THR. PMH - arthritis, anxiety, DVT, htn    PT Comments    Pt received in supine and agreeable to session. Pt making good progress towards goals this session and pt reporting mild pain in R quad muscle. Pt able to significantly increase gait distance and perform stair trials with up to min guard for safety. Pt does not demonstrate any unsteadiness with RW support for ambulation or during stair trials. Pt requesting to use the bathroom at the end of the session and is able to stand at the toilet to void urine and at the sink for hand hygiene without UE support. Anticipate pt and family will be able to manage pt's mobility needs at home.    Recommendations for follow up therapy are one component of a multi-disciplinary discharge planning process, led by the attending physician.  Recommendations may be updated based on patient status, additional functional criteria and insurance authorization.     Assistance Recommended at Discharge PRN  Patient can return home with the following Help with stairs or ramp for entrance;Assist for transportation;Assistance with cooking/housework   Equipment Recommendations  Rolling walker (2 wheels)    Recommendations for Other Services       Precautions / Restrictions Precautions Precautions: None Restrictions Weight Bearing Restrictions: No RLE Weight Bearing: Weight bearing as tolerated     Mobility  Bed Mobility Overal bed mobility: Needs Assistance Bed Mobility: Supine to Sit     Supine to sit: Min guard     General bed mobility comments: increased time and discussed using belt to elevate RLE to EOB when returning to supine    Transfers Overall transfer level: Needs assistance Equipment used: Rolling walker (2 wheels) Transfers: Sit  to/from Stand Sit to Stand: Supervision           General transfer comment: good carryover of hand placement and steady power up    Ambulation/Gait Ambulation/Gait assistance: Supervision Gait Distance (Feet): 550 Feet Assistive device: Rolling walker (2 wheels) Gait Pattern/deviations: Step-through pattern, Decreased stride length, Trunk flexed, Antalgic, Decreased stance time - right Gait velocity: decr     General Gait Details: slightly antalgic gait with cues for upright posture   Stairs Stairs: Yes Stairs assistance: Min guard Stair Management: Step to pattern, One rail Right, One rail Left Number of Stairs: 2 (x2) General stair comments: Pt performing stair trials with no unsteadiness noted. Cues for sequencing and technique. Instructions for guarding provided to tell wife when she is assisting.      Balance Overall balance assessment: No apparent balance deficits (not formally assessed)                                          Cognition Arousal/Alertness: Awake/alert Behavior During Therapy: WFL for tasks assessed/performed Overall Cognitive Status: Within Functional Limits for tasks assessed                                          Exercises      General Comments        Pertinent Vitals/Pain Pain Assessment Pain Assessment: Faces Faces Pain  Scale: Hurts a little bit Pain Location: rt quad Pain Descriptors / Indicators: Sore Pain Intervention(s): Monitored during session, Repositioned     PT Goals (current goals can now be found in the care plan section) Acute Rehab PT Goals Patient Stated Goal: return to walking 6-7 miles PT Goal Formulation: With patient Time For Goal Achievement: 10/30/22 Potential to Achieve Goals: Good Progress towards PT goals: Progressing toward goals    Frequency    7X/week      PT Plan Current plan remains appropriate       AM-PAC PT "6 Clicks" Mobility   Outcome  Measure  Help needed turning from your back to your side while in a flat bed without using bedrails?: None Help needed moving from lying on your back to sitting on the side of a flat bed without using bedrails?: A Little Help needed moving to and from a bed to a chair (including a wheelchair)?: A Little Help needed standing up from a chair using your arms (e.g., wheelchair or bedside chair)?: A Little Help needed to walk in hospital room?: A Little Help needed climbing 3-5 steps with a railing? : A Little 6 Click Score: 19    End of Session Equipment Utilized During Treatment: Gait belt Activity Tolerance: Patient tolerated treatment well Patient left: in chair;with call bell/phone within reach Nurse Communication: Mobility status PT Visit Diagnosis: Other abnormalities of gait and mobility (R26.89);Pain Pain - Right/Left: Right Pain - part of body: Hip     Time: 7829-5621 PT Time Calculation (min) (ACUTE ONLY): 27 min  Charges:  $Gait Training: 23-37 mins                     Johny Shock, PTA Acute Rehabilitation Services Secure Chat Preferred  [phone]    Johny Shock 10/27/2022, 9:02 AM

## 2022-10-27 NOTE — Discharge Summary (Signed)
Patient ID: [name] [name] MRN: [id number] DOB/AGE: 1966-02-22 57 y.o.  Admit date: 10/26/2022 Discharge date: 10/27/2022  Admission Diagnoses:  Principal Problem:   Primary osteoarthritis of right hip Active Problems:   Status post total replacement of right hip   Discharge Diagnoses:  Same  Past Medical History:  Diagnosis Date   Anxiety    Arthritis    DVT (deep venous thrombosis) (HCC)    GERD (gastroesophageal reflux disease)    History of hiatal hernia    Hyperlipidemia    Hypertension     Surgeries: Procedure(s): RIGHT TOTAL HIP ARTHROPLASTY ANTERIOR APPROACH on 10/26/2022   Consultants:   Discharged Condition: Improved  Hospital Course: [name] [name] is an 57 y.o. male who was admitted 10/26/2022 for operative treatment ofPrimary osteoarthritis of right hip. Patient has severe unremitting pain that affects sleep, daily activities, and work/hobbies. After pre-op clearance the patient was taken to the operating room on 10/26/2022 and underwent  Procedure(s): RIGHT TOTAL HIP ARTHROPLASTY ANTERIOR APPROACH.    Patient was given perioperative antibiotics:  Anti-infectives (From admission, onward)    Start     Dose/Rate Route Frequency Ordered Stop   10/26/22 1500  ceFAZolin (ANCEF) IVPB 2g/100 mL premix        2 g 200 mL/hr over 30 Minutes Intravenous Every 6 hours 10/26/22 1245 10/26/22 2216   10/26/22 1025  vancomycin (VANCOCIN) powder  Status:  Discontinued          As needed 10/26/22 1025 10/26/22 1057   10/26/22 0700  ceFAZolin (ANCEF) IVPB 2g/100 mL premix  Status:  Discontinued        2 g 200 mL/hr over 30 Minutes Intravenous On call to O.R. 10/26/22 0655 10/26/22 1245        Patient was given sequential compression devices, early ambulation, and chemoprophylaxis to prevent DVT.  Patient benefited maximally from hospital stay and there were no complications.    Recent vital signs: Patient Vitals for the past 24 hrs:  BP Temp Temp src Pulse Resp SpO2  Height Weight  10/27/22 0402 134/72 98.3 F (36.8 C) -- 82 17 93 % -- --  10/27/22 0016 (!) 154/88 98.4 F (36.9 C) -- 84 18 100 % -- --  10/26/22 2053 (!) 148/90 98.2 F (36.8 C) -- 76 17 96 % -- --  10/26/22 1259 129/80 (!) 97.5 F (36.4 C) Oral (!) 59 14 99 % 6' (1.829 m) 111.6 kg  10/26/22 1245 (!) 155/97 97.8 F (36.6 C) -- (!) 56 12 96 % -- --  10/26/22 1230 (!) 150/73 -- -- (!) 58 10 96 % -- --  10/26/22 1215 (!) 152/83 -- -- (!) 54 11 95 % -- --  10/26/22 1200 (!) 148/84 -- -- (!) 54 11 96 % -- --  10/26/22 1145 (!) 148/87 -- -- (!) 49 (!) 9 95 % -- --  10/26/22 1130 (!) 146/86 -- -- (!) 52 11 95 % -- --  10/26/22 1115 138/84 -- -- (!) 55 14 94 % -- --  10/26/22 1100 133/78 98 F (36.7 C) -- 63 16 96 % -- --     Recent laboratory studies:  Recent Labs    10/27/22 0147  WBC 10.1  HGB 13.0  HCT 38.5*  PLT 186     Discharge Medications:   Allergies as of 10/27/2022   No Known Allergies      Medication List     TAKE these medications    atorvastatin 40 MG tablet  Commonly known as: LIPITOR Take 40 mg by mouth daily.   Cholecalciferol 125 MCG (5000 UT) capsule Take 5,000 Units by mouth daily.   docusate sodium 100 MG capsule Commonly known as: Colace Take 1 capsule (100 mg total) by mouth daily as needed.   lisinopril-hydrochlorothiazide 10-12.5 MG tablet Commonly known as: ZESTORETIC Take 1 tablet by mouth daily.   methocarbamol 750 MG tablet Commonly known as: Robaxin-750 Take 1 tablet (750 mg total) by mouth 2 (two) times daily as needed for muscle spasms.   multivitamin with minerals Tabs tablet Take 1 tablet by mouth daily.   NON FORMULARY Pt uses a cpap nightly   ondansetron 4 MG tablet Commonly known as: Zofran Take 1 tablet (4 mg total) by mouth every 8 (eight) hours as needed for nausea or vomiting.   oxyCODONE-acetaminophen 5-325 MG tablet Commonly known as: Percocet Take 1-2 tablets by mouth every 6 (six) hours as needed. To be  taken after surgery   sertraline 50 MG tablet Commonly known as: ZOLOFT Take 50 mg by mouth daily.   tamsulosin 0.4 MG Caps capsule Commonly known as: FLOMAX Take 0.4 mg by mouth daily.   VITAMIN B COMPLEX PO Take 1 tablet by mouth daily.   Xarelto 20 MG Tabs tablet Generic drug: rivaroxaban Take 20 mg by mouth daily with supper.               Durable Medical Equipment  (From admission, onward)           Start     Ordered   10/26/22 1815  For home use only DME Walker rolling  Once       Question Answer Comment  Walker: With 5 Inch Wheels   Patient needs a walker to treat with the following condition Antalgic gait      10/26/22 1817   10/26/22 1246  DME Walker rolling  Once       Question:  Patient needs a walker to treat with the following condition  Answer:  History of hip replacement   10/26/22 1245   10/26/22 1246  DME 3 n 1  Once        10/26/22 1245   10/26/22 1246  DME Bedside commode  Once       Question:  Patient needs a bedside commode to treat with the following condition  Answer:  History of hip replacement   10/26/22 1245            Diagnostic Studies: DG Pelvis Portable  Result Date: 10/26/2022 CLINICAL DATA:  Postop right hip replacement. EXAM: PORTABLE PELVIS 1-2 VIEWS COMPARISON:  None Available. FINDINGS: Right hip arthroplasty in expected alignment. No periprosthetic lucency or fracture. Recent postsurgical change includes air and edema in the soft tissues. IMPRESSION: Right hip arthroplasty without immediate postoperative complication. Electronically Signed   By: Narda Rutherford M.D.   On: 10/26/2022 12:56   DG HIP UNILAT WITH PELVIS 1V RIGHT  Result Date: 10/26/2022 CLINICAL DATA:  Elective surgery. Total hip arthroplasty. EXAM: DG HIP (WITH OR WITHOUT PELVIS) 1V RIGHT COMPARISON:  Radiograph 07/07/2022 FINDINGS: Eight fluoroscopic spot views of the pelvis and right hip obtained in the operating room. Sequential images during hip  arthroplasty. Fluoroscopy time 26.8 seconds. Dose 4.66 mGy. IMPRESSION: Intraoperative fluoroscopy during right hip arthroplasty. Electronically Signed   By: Narda Rutherford M.D.   On: 10/26/2022 11:08   DG C-Arm 1-60 Min-No Report  Result Date: 10/26/2022 Fluoroscopy was utilized by the requesting physician.  No  radiographic interpretation.   DG C-Arm 1-60 Min-No Report  Result Date: 10/26/2022 Fluoroscopy was utilized by the requesting physician.  No radiographic interpretation.    Disposition: Discharge disposition: 01-Home or Self Care          Follow-up Information     Tarry Kos, MD. Schedule an appointment as soon as possible for a visit in 2 week(s).   Specialty: Orthopedic Surgery Contact information: [address] [place] [address] [phone]                  Signed: Cristie Hem 10/27/2022, 7:59 AM

## 2022-10-27 NOTE — Progress Notes (Signed)
Discharge instructions given. Patient verbalized understanding and all questions were answered.  ?

## 2022-10-27 NOTE — TOC Transition Note (Signed)
Transition of Care [facility] - CM/SW Discharge Note   Patient Details  Name: [name] [name] MRN: [id number] Date of Birth: 13-Aug-1965  Transition of Care [facility]) CM/SW Contact:  Lawerance Sabal, RN Phone Number: 10/27/2022, 9:47 AM   Clinical Narrative:     [name].  RW and 3/1 needed and requested to be delivered to room through [employer].  HH services set up pre op through [facility], and they have been notified of DC Patient will have wife transport home.   Final next level of care: Home w Home Health Services Barriers to Discharge: No Barriers Identified   Patient Goals and CMS Choice      Discharge Placement                         Discharge Plan and Services Additional resources added to the After Visit Summary for                  DME Arranged: 3-N-1, Walker rolling DME Agency: [employer] Date DME Agency Contacted: 10/27/22 Time DME Agency Contacted: [phone] Representative spoke with at DME Agency: Vaughan Basta HH Arranged: PT HH Agency: [facility] Date [facility] Agency Contacted: 10/27/22 Time HH Agency Contacted: [phone] Representative spoke with at [facility] Agency: Vaughan Basta  Social Determinants of Health (SDOH) Interventions SDOH Screenings   Food Insecurity: No Food Insecurity (10/26/2022)  Housing: Low Risk  (10/26/2022)  Transportation Needs: No Transportation Needs (10/26/2022)  Utilities: Not At Risk (10/26/2022)  Tobacco Use: Low Risk  (10/26/2022)     Readmission Risk Interventions     No data to display

## 2022-10-27 NOTE — Progress Notes (Signed)
Subjective: 1 Day Post-Op Procedure(s) (LRB): RIGHT TOTAL HIP ARTHROPLASTY ANTERIOR APPROACH (Right) Patient reports pain as mild.  No complaints this am  Objective: Vital signs in last 24 hours: Temp:  [97.5 F (36.4 C)-98.4 F (36.9 C)] 98.3 F (36.8 C) (06/11 0402) Pulse Rate:  [49-84] 82 (06/11 0402) Resp:  [9-18] 17 (06/11 0402) BP: (129-155)/(72-97) 134/72 (06/11 0402) SpO2:  [93 %-100 %] 93 % (06/11 0402) Weight:  [111.6 kg] 111.6 kg (06/10 1259)  Intake/Output from previous day: 06/10 0701 - 06/11 0700 In: 1276 [I.V.:1076; IV Piggyback:200] Out: 2475 [Urine:2475] Intake/Output this shift: No intake/output data recorded.  Recent Labs    10/27/22 0147  HGB 13.0   Recent Labs    10/27/22 0147  WBC 10.1  RBC 4.05*  HCT 38.5*  PLT 186   No results for input(s): "NA", "K", "CL", "CO2", "BUN", "CREATININE", "GLUCOSE", "CALCIUM" in the last 72 hours. No results for input(s): "LABPT", "INR" in the last 72 hours.  Neurologically intact Neurovascular intact Sensation intact distally Intact pulses distally Dorsiflexion/Plantar flexion intact Incision: dressing C/D/I No cellulitis present Compartment soft   Assessment/Plan: 1 Day Post-Op Procedure(s) (LRB): RIGHT TOTAL HIP ARTHROPLASTY ANTERIOR APPROACH (Right) Advance diet Up with therapy D/C IV fluids WBAT RLE D/c home once clears PT AND has urinated       Cristie Hem 10/27/2022, 7:58 AM

## 2022-10-29 ENCOUNTER — Encounter: Payer: Self-pay | Admitting: Orthopaedic Surgery

## 2022-11-10 ENCOUNTER — Ambulatory Visit (INDEPENDENT_AMBULATORY_CARE_PROVIDER_SITE_OTHER): Payer: Managed Care, Other (non HMO) | Admitting: Physician Assistant

## 2022-11-10 DIAGNOSIS — Z96641 Presence of right artificial hip joint: Secondary | ICD-10-CM

## 2022-11-10 NOTE — Progress Notes (Signed)
Post-Op Visit Note   Patient: [name] [name]           Date of Birth: [birth year]           MRN: [id number] Visit Date: 11/10/2022 PCP: Darrow Bussing, MD   Assessment & Plan:  Chief Complaint:  Chief Complaint  Patient presents with   Right Hip - Follow-up    Right total hip arthroplasty 10/26/2022   Visit Diagnoses:  1. Status post total replacement of right hip     Plan: Patient is a pleasant 57 year old gentleman who comes in today 2 weeks status post right total hip replacement 10/26/2022.  He has been doing well.  He is taking Tylenol and Robaxin for pain.  He is taking Xarelto for DVT prophylaxis as he has a history of postop DVT from Achilles repair in the past.  He is working on a home exercise program.  Currently ambulating unassisted.  He has been trying to wear his compression socks but notes difficulty with proximal swelling.  Examination of his right hip reveals a well-healed surgical incision with nylon sutures in place.  No evidence of infection or cellulitis.  He does have a fair amount of swelling to the entire right lower extremity.  Calf is soft and nontender.  No skin changes or warmth.  Negative Homans.  He has neurovascularly intact distally.  Today, sutures were removed and Steri-Strips applied.  In regards to the right lower extremity swelling, the patient notes that this is not uncommon with day-to-day activities.  He is not having any calf pain and has a negative Homans so he has opted to forego an ultrasound at this point.  Should he develop any worrisome symptoms he will let us know.  Postop instruction handout provided.  Follow-up with [place] in 4 weeks for repeat evaluation and AP pelvis x-rays.  Follow-Up Instructions: Return in about 4 weeks (around 12/08/2022).   Orders:  No orders of the defined types were placed in this encounter.  No orders of the defined types were placed in this encounter.   Imaging: No new imaging  PMFS History: Patient Active  Problem List   Diagnosis Date Noted   Status post total replacement of right hip 10/26/2022   Primary osteoarthritis of right hip 07/07/2022   Pain in left hip 07/07/2022   Varicose veins of right lower extremity with complications 10/19/2016   Chronic deep vein thrombosis (DVT) 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) 10/19/2016   Acute venous embolism and thrombosis of deep vessels of distal lower extremity (HCC) 06/23/2011   Phlebitis and thrombophlebitis of superficial vessels of lower extremities 06/23/2011   Past Medical History:  Diagnosis Date   Anxiety    Arthritis    DVT (deep venous thrombosis) (HCC)    GERD (gastroesophageal reflux disease)    History of hiatal hernia    Hyperlipidemia    Hypertension     Family History  Problem Relation Age of Onset   Stroke Father     Past Surgical History:  Procedure Laterality Date   COLONOSCOPY  2016   INGUINAL HERNIA REPAIR Right 2021   TOTAL HIP ARTHROPLASTY Right 10/26/2022   Procedure: RIGHT TOTAL HIP ARTHROPLASTY ANTERIOR APPROACH;  Surgeon: Tarry Kos, MD;  Location: [facility] OR;  Service: Orthopedics;  Laterality: Right;  3-C   UMBILICAL HERNIA REPAIR     Social History   Occupational History   Not on file  Tobacco Use   Smoking status: Never  Smokeless tobacco: Never  Vaping Use   Vaping Use: Never used  Substance and Sexual Activity   Alcohol use: Yes    Alcohol/week: 4.0 - 5.0 standard drinks of alcohol    Types: 4 - 5 Standard drinks or equivalent per week   Drug use: No   Sexual activity: Yes

## 2023-02-05 ENCOUNTER — Encounter: Payer: Self-pay | Admitting: Orthopaedic Surgery

## 2023-02-05 ENCOUNTER — Other Ambulatory Visit: Payer: Self-pay

## 2023-02-05 MED ORDER — AMOXICILLIN 500 MG PO TABS: ORAL_TABLET | ORAL | 2 refills | Status: AC

## 2023-06-04 ENCOUNTER — Other Ambulatory Visit: Payer: Self-pay

## 2023-06-04 ENCOUNTER — Encounter (HOSPITAL_BASED_OUTPATIENT_CLINIC_OR_DEPARTMENT_OTHER): Payer: Self-pay

## 2023-06-04 ENCOUNTER — Emergency Department (HOSPITAL_BASED_OUTPATIENT_CLINIC_OR_DEPARTMENT_OTHER)
Admission: EM | Admit: 2023-06-04 | Discharge: 2023-06-04 | Disposition: A | Discharge status: Home / Self Care | Payer: Managed Care, Other (non HMO)

## 2023-06-04 DIAGNOSIS — Z7901 Long term (current) use of anticoagulants: Secondary | ICD-10-CM | POA: Insufficient documentation

## 2023-06-04 DIAGNOSIS — Z79899 Other long term (current) drug therapy: Secondary | ICD-10-CM | POA: Insufficient documentation

## 2023-06-04 DIAGNOSIS — I10 Essential (primary) hypertension: Secondary | ICD-10-CM | POA: Diagnosis not present

## 2023-06-04 DIAGNOSIS — R002 Palpitations: Secondary | ICD-10-CM

## 2023-06-04 DIAGNOSIS — I4892 Unspecified atrial flutter: Secondary | ICD-10-CM | POA: Diagnosis not present

## 2023-06-04 LAB — CBC
HCT: 44.1 % (ref 39.0–52.0)
Hemoglobin: 15.1 g/dL (ref 13.0–17.0)
MCH: 31.3 pg (ref 26.0–34.0)
MCHC: 34.2 g/dL (ref 30.0–36.0)
MCV: 91.5 fL (ref 80.0–100.0)
Platelets: 260 10*3/uL (ref 150–400)
RBC: 4.82 MIL/uL (ref 4.22–5.81)
RDW: 12.6 % (ref 11.5–15.5)
WBC: 6.7 10*3/uL (ref 4.0–10.5)
nRBC: 0 % (ref 0.0–0.2)

## 2023-06-04 LAB — T4, FREE: Free T4: 0.86 ng/dL (ref 0.61–1.12)

## 2023-06-04 LAB — BASIC METABOLIC PANEL
Anion gap: 10 (ref 5–15)
BUN: 21 mg/dL — ABNORMAL HIGH (ref 6–20)
CO2: 25 mmol/L (ref 22–32)
Calcium: 9.8 mg/dL (ref 8.9–10.3)
Chloride: 105 mmol/L (ref 98–111)
Creatinine, Ser: 1.21 mg/dL (ref 0.61–1.24)
GFR, Estimated: >60 mL/min (ref >60)
Glucose, Bld: 118 mg/dL — ABNORMAL HIGH (ref 70–99)
Potassium: 4.5 mmol/L (ref 3.5–5.1)
Sodium: 140 mmol/L (ref 135–145)

## 2023-06-04 LAB — TSH: TSH: 2.128 u[IU]/mL (ref 0.350–4.500)

## 2023-06-04 LAB — TROPONIN I (HIGH SENSITIVITY)
Troponin I (High Sensitivity): 6 ng/L (ref <18)
Troponin I (High Sensitivity): 14 ng/L (ref <18)

## 2023-06-04 MED ORDER — DILTIAZEM LOAD VIA INFUSION: 10.0000 mg | Freq: Once | INTRAVENOUS | Status: DC

## 2023-06-04 MED ORDER — DILTIAZEM HCL ER 180 MG PO TB24: 180.0000 mg | ORAL_TABLET | Freq: Every day | ORAL | 0 refills | Status: DC

## 2023-06-04 MED ORDER — DILTIAZEM HCL 25 MG/5ML IV SOLN
10.0000 mg | Freq: Once | INTRAVENOUS | Status: AC
Start: 2023-06-04 — End: 2023-06-04
  Administered 2023-06-04: 10 mg via INTRAVENOUS
  Filled 2023-06-04: qty 5

## 2023-06-04 NOTE — ED Triage Notes (Signed)
Pt c/o fluttering x "a couple weeks," advises associated SHOB. States today he was walking, "felt it again, got dizzy & winded & the dizziness hasn't gone away." Denies hx

## 2023-06-04 NOTE — Discharge Instructions (Addendum)
You have any irregular heartbeat, it comes and goes, continue taking your blood thinner, do not miss any doses, as this will increase your risk of stroke.  I believe that you have a irregular heart beat, called a flutter, you go in and out of it, and I think that is what is causing the palpitations.  Please take the prescription as prescribed above.  Additionally stop taking your lisinopril hydrochlorothiazide.  Take your blood pressure daily

## 2023-06-04 NOTE — ED Provider Notes (Signed)
[place] EMERGENCY DEPARTMENT AT [facility] Provider Note   CSN: [id number] Arrival date & time: 06/04/23  1245     History  Chief Complaint  Patient presents with   Palpitations   Dizziness    [name] [name] is a 58 y.o. male, history of hypertension, DVT, hyperlipidemia, who presents to the ED secondary to 2 weeks of a fluttering in his chest.  States it last for couple minutes and goes away.  Is associated with some shortness of breath and chest pressure that is a 2 out of 10.  Notes that it is worse when he is laying down at night, and comes and goes.  Denies any recent surgeries, or falls prior to the accident.  Denies any swelling of 1 extremity, or new found shortness of breath.  Home Medications Prior to Admission medications   Medication Sig Start Date End Date Taking? Authorizing Provider  diltiazem (CARDIZEM LA) 180 MG 24 hr tablet Take 1 tablet (180 mg total) by mouth daily. 06/04/23  Yes Johneisha Broaden L, PA  amoxicillin (AMOXIL) 500 MG tablet Take 4 tablets by mouth 1 hour prior to dental procedure. 02/05/23   Tarry Kos, MD  atorvastatin (LIPITOR) 40 MG tablet Take 40 mg by mouth daily.    [provider]  B Complex Vitamins (VITAMIN B COMPLEX PO) Take 1 tablet by mouth daily.    [provider]  Cholecalciferol 125 MCG (5000 UT) capsule Take 5,000 Units by mouth daily.    [provider]  docusate sodium (COLACE) 100 MG capsule Take 1 capsule (100 mg total) by mouth daily as needed. 10/21/22 10/21/23  Cristie Hem, PA-C  lisinopril-hydrochlorothiazide (ZESTORETIC) 10-12.5 MG tablet Take 1 tablet by mouth daily.    [provider]  methocarbamol (ROBAXIN-750) 750 MG tablet Take 1 tablet (750 mg total) by mouth 2 (two) times daily as needed for muscle spasms. 10/21/22   Cristie Hem, PA-C  Multiple Vitamin (MULTIVITAMIN WITH MINERALS) TABS tablet Take 1 tablet by mouth daily.    [provider]  NON FORMULARY Pt  uses a cpap nightly    [provider]  ondansetron (ZOFRAN) 4 MG tablet Take 1 tablet (4 mg total) by mouth every 8 (eight) hours as needed for nausea or vomiting. 10/21/22   Cristie Hem, PA-C  oxyCODONE-acetaminophen (PERCOCET) 5-325 MG tablet Take 1-2 tablets by mouth every 6 (six) hours as needed. To be taken after surgery 10/21/22   Cristie Hem, PA-C  sertraline (ZOLOFT) 50 MG tablet Take 50 mg by mouth daily.    [provider]  tamsulosin (FLOMAX) 0.4 MG CAPS capsule Take 0.4 mg by mouth daily. 12/21/16   [provider]  XARELTO 20 MG TABS tablet Take 20 mg by mouth daily with supper.    [provider]      Allergies    Patient has no known allergies.    Review of Systems   Review of Systems  Respiratory:  Positive for shortness of breath.   Cardiovascular:  Positive for chest pain and palpitations. Negative for leg swelling.  Neurological:  Positive for dizziness.    Physical Exam Updated Vital Signs BP 130/81   Pulse 74   Temp 97.9 F (36.6 C)   Resp 15   Ht 6' (1.829 m)   Wt 103 kg   SpO2 98%   BMI 30.79 kg/m  Physical Exam Vitals and nursing note reviewed.  Constitutional:  General: He is not in acute distress.    Appearance: He is well-developed.  HENT:     Head: Normocephalic and atraumatic.  Eyes:     Conjunctiva/sclera: Conjunctivae normal.  Cardiovascular:     Rate and Rhythm: Normal rate and regular rhythm.     Heart sounds: No murmur heard. Pulmonary:     Effort: Pulmonary effort is normal. No respiratory distress.     Breath sounds: Normal breath sounds.  Abdominal:     Palpations: Abdomen is soft.     Tenderness: There is no abdominal tenderness.  Musculoskeletal:        General: No swelling.     Cervical back: Neck supple.  Skin:    General: Skin is warm and dry.     Capillary Refill: Capillary refill takes less than 2 seconds.  Neurological:     Mental Status: He is alert.  Psychiatric:         Mood and Affect: Mood normal.     ED Results / Procedures / Treatments   Labs (all labs ordered are listed, but only abnormal results are displayed) Labs Reviewed  BASIC METABOLIC PANEL - Abnormal; Notable for the following components:      Result Value   Glucose, Bld 118 (*)    BUN 21 (*)    All other components within normal limits  CBC  TSH  T4, FREE  TROPONIN I (HIGH SENSITIVITY)  TROPONIN I (HIGH SENSITIVITY)    EKG None  Radiology No results found.  Procedures Procedures    Medications Ordered in ED Medications  diltiazem (CARDIZEM) injection 10 mg (10 mg Intravenous Given 06/04/23 1508)    ED Course/ Medical Decision Making/ A&P                                 Medical Decision Making Patient here for palpitations, that been going on for the last couple weeks.  Has been compliant with blood thinner.  States it comes and goes.  Denies any recent trauma.  Has some shortness of breath and chest pressure, associated with it.  Will obtain EKG, chest x-ray, blood work for further evaluation.  On my eval, patient became tachycardic in the 140s, SVT versus A-fib, repeat EKGs obtained see below for further evaluation.  Discussed with Dr. Rhae Hammock, he recommends giving dose of Cardizem monitoring for improvement.  Patient has been compliant with blood thinner  Amount and/or Complexity of Data Reviewed Labs: ordered.    Details: Unremarkable normal troponins Radiology:     Details: Chest x-ray clear ECG/medicine tests: ordered.    Details: Initial EKG shows sinus tachycardia, second EKG shows findings concerning for a flutter versus fib Discussion of management or test interpretation with external provider(s): Discussed with Dr. Jens Som cardiology on-call, since the patient got the Cardizem, he has been in normal sinus rhythm, nontachycardic.  Well-appearing feels better.  Dr. Jens Som believes that the second EKG shows a flutter, recommends patient's stop his lisinopril  hydrochlorothiazide, and start on Cardizem 180 mg daily.  Follow-up with cardiology.  Put referral in, instructed patient to call cardiology clinic, and discussed return precautions.  We discussed taking his blood pressure daily, to monitor and see if he needs to add his lisinopril hydrochlorothiazide back in.  And to follow-up with PCP.  I discharged, patient in normal sinus rhythm.  Feeling much better.  Provide is for prescription, for Cardizem outpatient  Risk Prescription drug management.  Final Clinical Impression(s) / ED Diagnoses Final diagnoses:  Atrial flutter, unspecified type (HCC)  Palpitations    Rx / DC Orders ED Discharge Orders          Ordered    Ambulatory referral to Cardiology       Comments: If you have not heard from the Cardiology office within the next 72 hours please call [phone].   06/04/23 1602    diltiazem (CARDIZEM LA) 180 MG 24 hr tablet  Daily        06/04/23 1605              Zaylei Mullane, [place], [place] 06/04/23 1608    Durwin Glaze, MD 06/07/23 [phone]

## 2023-06-08 ENCOUNTER — Ambulatory Visit: Payer: Managed Care, Other (non HMO) | Admitting: Internal Medicine

## 2023-07-14 ENCOUNTER — Ambulatory Visit (INDEPENDENT_AMBULATORY_CARE_PROVIDER_SITE_OTHER): Payer: Managed Care, Other (non HMO)

## 2023-07-14 ENCOUNTER — Encounter: Payer: Self-pay | Admitting: Cardiology

## 2023-07-14 ENCOUNTER — Ambulatory Visit: Payer: Managed Care, Other (non HMO) | Attending: Cardiology | Admitting: Cardiology

## 2023-07-14 VITALS — BP 152/98 | HR 68 | Ht 72.0 in | Wt 237.8 lb

## 2023-07-14 DIAGNOSIS — I10 Essential (primary) hypertension: Secondary | ICD-10-CM

## 2023-07-14 DIAGNOSIS — Z7689 Persons encountering health services in other specified circumstances: Secondary | ICD-10-CM

## 2023-07-14 DIAGNOSIS — I4892 Unspecified atrial flutter: Secondary | ICD-10-CM

## 2023-07-14 DIAGNOSIS — G4733 Obstructive sleep apnea (adult) (pediatric): Secondary | ICD-10-CM

## 2023-07-14 MED ORDER — DILTIAZEM HCL ER COATED BEADS 240 MG PO CP24: 240.0000 mg | ORAL_CAPSULE | Freq: Every day | ORAL | 3 refills | Status: AC

## 2023-07-14 NOTE — Progress Notes (Unsigned)
 Enrolled for [employer] to mail a ZIO XT long term holter monitor to the patients address on file.

## 2023-07-14 NOTE — Progress Notes (Signed)
 Cardiology Office Note:    Date:  07/14/2023   ID:  [name] [name], DOB 1965-06-03, MRN [id number]  PCP:  Darrow Bussing, MD  Cardiologist:  None  Electrophysiologist:  None   Referring MD: Pete Pelt, PA   " I am doing well"  History of Present Illness:    [name] [name] is a 58 y.o. male with a hx of recently diagnosed atrial flutter currently on Cardizem, on Xeralto for DVT, Sleep apnea on CPAP, elevated creatinine suspicious for kidney disease stage I  He tells me that he had been experiencing intermittent palpitations and heart fluttering.  He reports that these episodes often occur during physical activity, such as walking, and when lying flat in bed. The patient notes that he has had to stop his activity on several occasions due to feelings of dizziness. He has attempted to manage these symptoms by slowing his pace during exercise and using an additional pillow when sleeping.  Recently he was seen in emergency department Medrol refractory at that time he was in atrial flutter.  Spontaneously converted.  During that visit his medication was adjusted.  Lisinopril stopped and started on Cardizem.  His hydrochlorothiazide was continued.  He had already been on Xarelto for his EVD.   Past Medical History:  Diagnosis Date   Anxiety    Arthritis    DVT (deep venous thrombosis) (HCC)    GERD (gastroesophageal reflux disease)    History of hiatal hernia    Hyperlipidemia    Hypertension     Past Surgical History:  Procedure Laterality Date   COLONOSCOPY  2016   INGUINAL HERNIA REPAIR Right 2021   TOTAL HIP ARTHROPLASTY Right 10/26/2022   Procedure: RIGHT TOTAL HIP ARTHROPLASTY ANTERIOR APPROACH;  Surgeon: Tarry Kos, MD;  Location: [facility] OR;  Service: Orthopedics;  Laterality: Right;  3-C   UMBILICAL HERNIA REPAIR      Current Medications: Current Meds  Medication Sig   atorvastatin (LIPITOR) 40 MG tablet Take 40 mg by mouth daily.   B Complex Vitamins (VITAMIN B  COMPLEX PO) Take 1 tablet by mouth daily.   diltiazem (CARDIZEM CD) 240 MG 24 hr capsule Take 1 capsule (240 mg total) by mouth daily.   hydrochlorothiazide (HYDRODIURIL) 12.5 MG tablet Take 12.5 mg by mouth daily. PATIENT TAKES 1/2 A TABLET DAILY.   Multiple Vitamin (MULTIVITAMIN WITH MINERALS) TABS tablet Take 1 tablet by mouth daily.   NON FORMULARY Pt uses a cpap nightly   sertraline (ZOLOFT) 50 MG tablet Take 50 mg by mouth daily.   tamsulosin (FLOMAX) 0.4 MG CAPS capsule Take 0.4 mg by mouth daily.   XARELTO 20 MG TABS tablet Take 20 mg by mouth daily with supper.   [DISCONTINUED] diltiazem (CARDIZEM LA) 180 MG 24 hr tablet Take 1 tablet (180 mg total) by mouth daily.     Allergies:   Patient has no known allergies.   Social History   Socioeconomic History   Marital status: Married    Spouse name: Not on file   Number of children: Not on file   Years of education: Not on file   Highest education level: Not on file  Occupational History   Not on file  Tobacco Use   Smoking status: Never   Smokeless tobacco: Never  Vaping Use   Vaping status: Never Used  Substance and Sexual Activity   Alcohol use: Yes    Alcohol/week: 4.0 - 5.0 standard drinks of alcohol    Types:  4 - 5 Standard drinks or equivalent per week   Drug use: No   Sexual activity: Yes  Other Topics Concern   Not on file  Social History Narrative   Not on file   Social Drivers of Health   Financial Resource Strain: Not on file  Food Insecurity: No Food Insecurity (10/26/2022)   Hunger Vital Sign    Worried About Running Out of Food in the Last Year: Never true    Ran Out of Food in the Last Year: Never true  Transportation Needs: No Transportation Needs (10/26/2022)   PRAPARE - Administrator, Civil Service (Medical): No    Lack of Transportation (Non-Medical): No  Physical Activity: Not on file  Stress: Not on file  Social Connections: Not on file     Family History: The patient's  family history includes Stroke in his father.  ROS:   Review of Systems  Constitution: Negative for decreased appetite, fever and weight gain.  HENT: Negative for congestion, ear discharge, hoarse voice and sore throat.   Eyes: Negative for discharge, redness, vision loss in right eye and visual halos.  Cardiovascular: Negative for chest pain, dyspnea on exertion, leg swelling, orthopnea and palpitations.  Respiratory: Negative for cough, hemoptysis, shortness of breath and snoring.   Endocrine: Negative for heat intolerance and polyphagia.  Hematologic/Lymphatic: Negative for bleeding problem. Does not bruise/bleed easily.  Skin: Negative for flushing, nail changes, rash and suspicious lesions.  Musculoskeletal: Negative for arthritis, joint pain, muscle cramps, myalgias, neck pain and stiffness.  Gastrointestinal: Negative for abdominal pain, bowel incontinence, diarrhea and excessive appetite.  Genitourinary: Negative for decreased libido, genital sores and incomplete emptying.  Neurological: Negative for brief paralysis, focal weakness, headaches and loss of balance.  Psychiatric/Behavioral: Negative for altered mental status, depression and suicidal ideas.  Allergic/Immunologic: Negative for HIV exposure and persistent infections.    EKGs/Labs/Other Studies Reviewed:    The following studies were reviewed today:   EKG:  The ekg ordered today demonstrates sinus rhythm, heart rate 60 bpm.  Recent Labs: 06/04/2023: BUN 21; Creatinine, Ser 1.21; Hemoglobin 15.1; Platelets 260; Potassium 4.5; Sodium 140; TSH 2.128  Recent Lipid Panel No results found for: "CHOL", "TRIG", "HDL", "CHOLHDL", "VLDL", "LDLCALC", "LDLDIRECT"  Physical Exam:    VS:  BP (!) 152/98 (BP Location: Right Arm)   Pulse 68   Ht 6' (1.829 m)   Wt 237 lb 12.8 oz (107.9 kg)   SpO2 95%   BMI 32.25 kg/m     Wt Readings from Last 3 Encounters:  07/14/23 237 lb 12.8 oz (107.9 kg)  06/04/23 227 lb (103 kg)   10/26/22 246 lb (111.6 kg)     GEN: Well nourished, well developed in no acute distress HEENT: Normal NECK: No JVD; No carotid bruits LYMPHATICS: No lymphadenopathy CARDIAC: S1S2 noted,RRR, no murmurs, rubs, gallops RESPIRATORY:  Clear to auscultation without rales, wheezing or rhonchi  ABDOMEN: Soft, non-tender, non-distended, +bowel sounds, no guarding. EXTREMITIES: No edema, No cyanosis, no clubbing MUSCULOSKELETAL:  No deformity  SKIN: Warm and dry NEUROLOGIC:  Alert and oriented x 3, non-focal PSYCHIATRIC:  Normal affect, good insight  ASSESSMENT:    1. Encounter to establish care   2. Atrial flutter, unspecified type (HCC)   3. Hypertension, unspecified type   4. OSA on CPAP    PLAN:    Atrial Flutter Patient reports feeling fluttering sensation, especially during physical activity and at rest. Discussed the nature of atrial flutter and its potential to  lead to stroke. Patient is currently on Xarelto for prior DVT.  CHADS2 Vascor is 1 -Order 14-day heart monitor to assess the burden of atrial flutter and presence of other arrhythmias. -Order echocardiogram to assess heart structure and function. -Consider rhythm control medication pending results of monitoring and echocardiogram. -Increase Cardizem dose to 240mg  for rate control. Once further information with his monitor and echocardiogram plan to refer the patient to EP to discuss rhythm control antiarrhythmic versus ablation.   Hypertension Blood pressure elevated at today's visit. Patient recently switched from Lisinopril to Hydrochlorothiazide. -Increase Cardizem dose to 240mg . -Refer to pharmacy team for blood pressure management in 4-6 weeks. -Advise patient to bring home blood pressure cuff to next visit for calibration.  Chronic Kidney Disease Recent labs indicate potential chronic kidney disease. Patient was previously on Lisinopril, now on Hydrochlorothiazide. -Repeat kidney function tests  today. -Follow-up in 12-16 weeks to review results.  General Health Maintenance -Advise patient to moderate alcohol and caffeine intake. -Encourage continuation of healthy diet. -Advise patient to elevate feet during the day to help with leg swelling. -Continue CPAP for sleep apnea.  The patient is in agreement with the above plan. The patient left the office in stable condition.  The patient will follow up in   Medication Adjustments/Labs and Tests Ordered: Current medicines are reviewed at length with the patient today.  Concerns regarding medicines are outlined above.  Orders Placed This Encounter  Procedures   AMB Referral to [facility]   LONG TERM MONITOR (3-14 DAYS)   EKG 12-Lead   ECHOCARDIOGRAM COMPLETE   Meds ordered this encounter  Medications   diltiazem (CARDIZEM CD) 240 MG 24 hr capsule    Sig: Take 1 capsule (240 mg total) by mouth daily.    Dispense:  90 capsule    Refill:  3    Patient Instructions  Medication Instructions:  Your physician has recommended you make the following change in your medication:  INCREASE: Cardizem 240 mg once daily  Please bring your blood pressure suff to your next appointment.  *If you need a refill on your cardiac medications before your next appointment, please call your pharmacy*  Testing/Procedures: Your physician has requested that you have an echocardiogram. Echocardiography is a painless test that uses sound waves to create images of your heart. It provides your doctor with information about the size and shape of your heart and how well your heart's chambers and valves are working. This procedure takes approximately one hour. There are no restrictions for this procedure. Please do NOT wear cologne, perfume, aftershave, or lotions (deodorant is allowed). Please arrive 15 minutes prior to your appointment time.  Please note: We ask at that you not bring children with you during ultrasound (echo/ vascular) testing. Due  to room size and safety concerns, children are not allowed in the ultrasound rooms during exams. Our front office staff cannot provide observation of children in our lobby area while testing is being conducted. An adult accompanying a patient to their appointment will only be allowed in the ultrasound room at the discretion of the ultrasound technician under special circumstances. We apologize for any inconvenience.  ZIO XT- Long Term Monitor Instructions  Your physician has requested you wear a ZIO patch monitor for 14 days.  This is a single patch monitor. [employer] supplies one patch monitor per enrollment. Additional stickers are not available. Please do not apply patch if you will be having a Nuclear Stress Test,  Echocardiogram, Cardiac CT, MRI, or  Chest Xray during the period you would be wearing the  monitor. The patch cannot be worn during these tests. You cannot remove and re-apply the  ZIO XT patch monitor.  Your ZIO patch monitor will be mailed 3 day USPS to your address on file. It may take 3-5 days  to receive your monitor after you have been enrolled.  Once you have received your monitor, please review the enclosed instructions. Your monitor  has already been registered assigning a specific monitor serial # to you.  Billing and Patient Assistance Program Information  We have supplied [employer] with any of your insurance information on file for billing purposes. [employer] offers a sliding scale Patient Assistance Program for patients that do not have  insurance, or whose insurance does not completely cover the cost of the ZIO monitor.  You must apply for the Patient Assistance Program to qualify for this discounted rate.  To apply, please call [employer] at [phone], select option 4, select option 2, ask to apply for  Patient Assistance Program. Meredeth Ide will ask your household income, and how many people  are in your household. They will quote your out-of-pocket cost based on that  information.  [employer] will also be able to set up a 26-month, interest-free payment plan if needed.  Applying the monitor   Shave hair from upper left chest.  Hold abrader disc by orange tab. Rub abrader in 40 strokes over the upper left chest as  indicated in your monitor instructions.  Clean area with 4 enclosed alcohol pads. Let dry.  Apply patch as indicated in monitor instructions. Patch will be placed under collarbone on left  side of chest with arrow pointing upward.  Rub patch adhesive wings for 2 minutes. Remove white label marked "1". Remove the white  label marked "2". Rub patch adhesive wings for 2 additional minutes.  While looking in a mirror, press and release button in center of patch. A small green light will  flash 3-4 times. This will be your only indicator that the monitor has been turned on.  Do not shower for the first 24 hours. You may shower after the first 24 hours.  Press the button if you feel a symptom. You will hear a small click. Record Date, Time and  Symptom in the Patient Logbook.  When you are ready to remove the patch, follow instructions on the last 2 pages of Patient  Logbook. Stick patch monitor onto the last page of Patient Logbook.  Place Patient Logbook in the blue and white box. Use locking tab on box and tape box closed  securely. The blue and white box has prepaid postage on it. Please place it in the mailbox as  soon as possible. Your physician should have your test results approximately 7 days after the  monitor has been mailed back to [facility].  Call [facility] Customer Care at [phone] if you have questions regarding  your ZIO XT patch monitor. Call them immediately if you see an orange light blinking on your  monitor.  If your monitor falls off in less than 4 days, contact our Monitor department at [phone].  If your monitor becomes loose or falls off after 4 days call [employer] at [phone] for  suggestions on  securing your monitor   Follow-Up: At [facility], you and your health needs are our priority.  As part of our continuing mission to provide you with exceptional heart care, we have created designated Provider Care Teams.  These  Care Teams include your primary Cardiologist (physician) and Advanced Practice Providers (APPs -  Physician Assistants and Nurse Practitioners) who all work together to provide you with the care you need, when you need it.    Your next appointment:   12 week(s)  Provider:   Thomasene Ripple, DO   Other Instructions Please see our pharmacy staff in 4-6 weeks  Mediterranean Diet A Mediterranean diet is based on the traditions of countries on the [place] Sea. It focuses on eating more: Fruits and vegetables. Whole grains, beans, nuts, and seeds. Heart-healthy fats. These are fats that are good for your heart. It involves eating less: Dairy. Meat and eggs. Processed foods with added sugar, salt, and fat. This type of diet can help prevent certain conditions. It can also improve outcomes if you have a long-term (chronic) disease, such as kidney or heart disease. What are tips for following this plan? Reading food labels Check packaged foods for: The serving size. For foods such as rice and pasta, the serving size is the amount of cooked product, not dry. The total fat. Avoid foods with saturated fat or trans fat. Added sugars, such as corn syrup. Shopping  Try to have a balanced diet. Buy a variety of foods, such as: Fresh fruits and vegetables. You may be able to get these from local farmers markets. You can also buy them frozen. Grains, beans, nuts, and seeds. Some of these can be bought in bulk. Fresh seafood. Poultry and eggs. Low-fat dairy products. Buy whole ingredients instead of foods that have already been packaged. If you can't get fresh seafood, buy precooked frozen shrimp or canned fish, such as tuna, salmon, or sardines. Stock your  pantry so you always have certain foods on hand, such as olive oil, canned tuna, canned tomatoes, rice, pasta, and beans. Cooking Cook foods with extra-virgin olive oil instead of using butter or other vegetable oils. Have meat as a side dish. Have vegetables or grains as your main dish. This means having meat in small portions or adding small amounts of meat to foods like pasta or stew. Use beans or vegetables instead of meat in common dishes like chili or lasagna. Try out different cooking methods. Try roasting, broiling, steaming, and sauting vegetables. Add frozen vegetables to soups, stews, pasta, or rice. Add nuts or seeds for added healthy fats and plant protein at each meal. You can add these to yogurt, salads, or vegetable dishes. Marinate fish or vegetables using olive oil, lemon juice, garlic, and fresh herbs. Meal planning Plan to eat a vegetarian meal one day each week. Try to work up to two vegetarian meals, if possible. Eat seafood two or more times a week. Have healthy snacks on hand. These may include: Vegetable sticks with hummus. Greek yogurt. Fruit and nut trail mix. Eat balanced meals. These should include: Fruit: 2-3 servings a day. Vegetables: 4-5 servings a day. Low-fat dairy: 2 servings a day. Fish, poultry, or lean meat: 1 serving a day. Beans and legumes: 2 or more servings a week. Nuts and seeds: 1-2 servings a day. Whole grains: 6-8 servings a day. Extra-virgin olive oil: 3-4 servings a day. Limit red meat and sweets to just a few servings a month. Lifestyle  Try to cook and eat meals with your family. Drink enough fluid to keep your pee (urine) pale yellow. Be active every day. This includes: Aerobic exercise, which is exercise that causes your heart to beat faster. Examples include running and swimming. Leisure activities like  gardening, walking, or housework. Get 7-8 hours of sleep each night. Drink red wine if your provider says you can. A glass of  wine is 5 oz (150 mL). You may be allowed to have: Up to 1 glass a day if you're male and not pregnant. Up to 2 glasses a day if you're male. What foods should I eat? Fruits Apples. Apricots. Avocado. Berries. Bananas. Cherries. Dates. Figs. Grapes. Lemons. Melon. Oranges. Peaches. Plums. Pomegranate. Vegetables Artichokes. Beets. Broccoli. Cabbage. Carrots. Eggplant. Green beans. Chard. Kale. Spinach. Onions. Leeks. Peas. Squash. Tomatoes. Peppers. Radishes. Grains Whole-grain pasta. Brown rice. Bulgur wheat. Polenta. Couscous. Whole-wheat bread. Orpah Cobb. Meats and other proteins Beans. Almonds. Sunflower seeds. Pine nuts. Peanuts. Cod. Salmon. Scallops. Shrimp. Tuna. Tilapia. Clams. Oysters. Eggs. Chicken or [place] without skin. Dairy Low-fat milk. Cheese. Greek yogurt. Fats and oils Extra-virgin olive oil. Avocado oil. Grapeseed oil. Beverages Water. Red wine. Herbal tea. Sweets and desserts Greek yogurt with honey. Baked apples. Poached pears. Trail mix. Seasonings and condiments Basil. Cilantro. Coriander. Cumin. Mint. Parsley. Sage. Rosemary. Tarragon. Garlic. Oregano. Thyme. Pepper. Balsamic vinegar. Tahini. Hummus. Tomato sauce. Olives. Mushrooms. The items listed above may not be all the foods and drinks you can have. Talk to a dietitian to learn more. What foods should I limit? This is a list of foods that should be eaten rarely. Fruits Fruit canned in syrup. Vegetables Deep-fried potatoes, like [place] fries. Grains Packaged pasta or rice dishes. Cereal with added sugar. Snacks with added sugar. Meats and other proteins Beef. Pork. Lamb. Chicken or [place] with skin. Hot dogs. Tomasa Blase. Dairy Ice cream. Sour cream. Whole milk. Fats and oils Butter. Canola oil. Vegetable oil. Beef fat (tallow). Lard. Beverages Juice. Sugar-sweetened soft drinks. Beer. Liquor and spirits. Sweets and desserts Cookies. Cakes. Pies. Candy. Seasonings and condiments Mayonnaise.  Pre-made sauces and marinades. The items listed above may not be all the foods and drinks you should limit. Talk to a dietitian to learn more. Where to find more information American Heart Association (AHA): heart.org This information is not intended to replace advice given to you by your health care provider. Make sure you discuss any questions you have with your health care provider. Document Revised: 08/16/2022 Document Reviewed: 08/16/2022 Elsevier Patient Education  2024 Elsevier Inc.   Adopting a Healthy Lifestyle.  Know what a healthy weight is for you (roughly BMI <25) and aim to maintain this   Aim for 7+ servings of fruits and vegetables daily   65-80+ fluid ounces of water or unsweet tea for healthy kidneys   Limit to max 1 drink of alcohol per day; avoid smoking/tobacco   Limit animal fats in diet for cholesterol and heart health - choose grass fed whenever available   Avoid highly processed foods, and foods high in saturated/trans fats   Aim for low stress - take time to unwind and care for your mental health   Aim for 150 min of moderate intensity exercise weekly for heart health, and weights twice weekly for bone health   Aim for 7-9 hours of sleep daily   When it comes to diets, agreement about the perfect plan isnt easy to find, even among the experts. Experts at the [facility] [employer] developed an idea known as the Healthy Eating Plate. Just imagine a plate divided into logical, healthy portions.   The emphasis is on diet quality:   Load up on vegetables and fruits - one-half of your plate: Aim for color and variety, and  remember that potatoes dont count.   Go for whole grains - one-quarter of your plate: Whole wheat, barley, wheat berries, quinoa, oats, brown rice, and foods made with them. If you want pasta, go with whole wheat pasta.   Protein power - one-quarter of your plate: Fish, chicken, beans, and nuts are all healthy, versatile protein  sources. Limit red meat.   The diet, however, does go beyond the plate, offering a few other suggestions.   Use healthy plant oils, such as olive, canola, soy, corn, sunflower and peanut. Check the labels, and avoid partially hydrogenated oil, which have unhealthy trans fats.   If youre thirsty, drink water. Coffee and tea are good in moderation, but skip sugary drinks and limit milk and dairy products to one or two daily servings.   The type of carbohydrate in the diet is more important than the amount. Some sources of carbohydrates, such as vegetables, fruits, whole grains, and beans-are healthier than others.   Finally, stay active  Osvaldo Shipper, DO  07/14/2023 12:05 PM    [facility]

## 2023-07-14 NOTE — Patient Instructions (Addendum)
 Medication Instructions:  Your physician has recommended you make the following change in your medication:  INCREASE: Cardizem 240 mg once daily  Please bring your blood pressure suff to your next appointment.  *If you need a refill on your cardiac medications before your next appointment, please call your pharmacy*  Testing/Procedures: Your physician has requested that you have an echocardiogram. Echocardiography is a painless test that uses sound waves to create images of your heart. It provides your doctor with information about the size and shape of your heart and how well your heart's chambers and valves are working. This procedure takes approximately one hour. There are no restrictions for this procedure. Please do NOT wear cologne, perfume, aftershave, or lotions (deodorant is allowed). Please arrive 15 minutes prior to your appointment time.  Please note: We ask at that you not bring children with you during ultrasound (echo/ vascular) testing. Due to room size and safety concerns, children are not allowed in the ultrasound rooms during exams. Our front office staff cannot provide observation of children in our lobby area while testing is being conducted. An adult accompanying a patient to their appointment will only be allowed in the ultrasound room at the discretion of the ultrasound technician under special circumstances. We apologize for any inconvenience.  ZIO XT- Long Term Monitor Instructions  Your physician has requested you wear a ZIO patch monitor for 14 days.  This is a single patch monitor. [employer] supplies one patch monitor per enrollment. Additional stickers are not available. Please do not apply patch if you will be having a Nuclear Stress Test,  Echocardiogram, Cardiac CT, MRI, or Chest Xray during the period you would be wearing the  monitor. The patch cannot be worn during these tests. You cannot remove and re-apply the  ZIO XT patch monitor.  Your ZIO patch monitor  will be mailed 3 day USPS to your address on file. It may take 3-5 days  to receive your monitor after you have been enrolled.  Once you have received your monitor, please review the enclosed instructions. Your monitor  has already been registered assigning a specific monitor serial # to you.  Billing and Patient Assistance Program Information  We have supplied [employer] with any of your insurance information on file for billing purposes. [employer] offers a sliding scale Patient Assistance Program for patients that do not have  insurance, or whose insurance does not completely cover the cost of the ZIO monitor.  You must apply for the Patient Assistance Program to qualify for this discounted rate.  To apply, please call [employer] at [phone], select option 4, select option 2, ask to apply for  Patient Assistance Program. Meredeth Ide will ask your household income, and how many people  are in your household. They will quote your out-of-pocket cost based on that information.  [employer] will also be able to set up a 45-month, interest-free payment plan if needed.  Applying the monitor   Shave hair from upper left chest.  Hold abrader disc by orange tab. Rub abrader in 40 strokes over the upper left chest as  indicated in your monitor instructions.  Clean area with 4 enclosed alcohol pads. Let dry.  Apply patch as indicated in monitor instructions. Patch will be placed under collarbone on left  side of chest with arrow pointing upward.  Rub patch adhesive wings for 2 minutes. Remove white label marked "1". Remove the white  label marked "2". Rub patch adhesive wings for 2 additional minutes.  While looking  in a mirror, press and release button in center of patch. A small green light will  flash 3-4 times. This will be your only indicator that the monitor has been turned on.  Do not shower for the first 24 hours. You may shower after the first 24 hours.  Press the button if you feel a symptom. You  will hear a small click. Record Date, Time and  Symptom in the Patient Logbook.  When you are ready to remove the patch, follow instructions on the last 2 pages of Patient  Logbook. Stick patch monitor onto the last page of Patient Logbook.  Place Patient Logbook in the blue and white box. Use locking tab on box and tape box closed  securely. The blue and white box has prepaid postage on it. Please place it in the mailbox as  soon as possible. Your physician should have your test results approximately 7 days after the  monitor has been mailed back to [facility].  Call [facility] Customer Care at [phone] if you have questions regarding  your ZIO XT patch monitor. Call them immediately if you see an orange light blinking on your  monitor.  If your monitor falls off in less than 4 days, contact our Monitor department at [phone].  If your monitor becomes loose or falls off after 4 days call [employer] at [phone] for  suggestions on securing your monitor   Follow-Up: At [facility], you and your health needs are our priority.  As part of our continuing mission to provide you with exceptional heart care, we have created designated Provider Care Teams.  These Care Teams include your primary Cardiologist (physician) and Advanced Practice Providers (APPs -  Physician Assistants and Nurse Practitioners) who all work together to provide you with the care you need, when you need it.    Your next appointment:   12 week(s)  Provider:   Thomasene Ripple, DO   Other Instructions Please see our pharmacy staff in 4-6 weeks  Mediterranean Diet A Mediterranean diet is based on the traditions of countries on the [place] Sea. It focuses on eating more: Fruits and vegetables. Whole grains, beans, nuts, and seeds. Heart-healthy fats. These are fats that are good for your heart. It involves eating less: Dairy. Meat and eggs. Processed foods with added sugar, salt, and  fat. This type of diet can help prevent certain conditions. It can also improve outcomes if you have a long-term (chronic) disease, such as kidney or heart disease. What are tips for following this plan? Reading food labels Check packaged foods for: The serving size. For foods such as rice and pasta, the serving size is the amount of cooked product, not dry. The total fat. Avoid foods with saturated fat or trans fat. Added sugars, such as corn syrup. Shopping  Try to have a balanced diet. Buy a variety of foods, such as: Fresh fruits and vegetables. You may be able to get these from local farmers markets. You can also buy them frozen. Grains, beans, nuts, and seeds. Some of these can be bought in bulk. Fresh seafood. Poultry and eggs. Low-fat dairy products. Buy whole ingredients instead of foods that have already been packaged. If you can't get fresh seafood, buy precooked frozen shrimp or canned fish, such as tuna, salmon, or sardines. Stock your pantry so you always have certain foods on hand, such as olive oil, canned tuna, canned tomatoes, rice, pasta, and beans. Cooking Cook foods with extra-virgin olive oil instead of  using butter or other vegetable oils. Have meat as a side dish. Have vegetables or grains as your main dish. This means having meat in small portions or adding small amounts of meat to foods like pasta or stew. Use beans or vegetables instead of meat in common dishes like chili or lasagna. Try out different cooking methods. Try roasting, broiling, steaming, and sauting vegetables. Add frozen vegetables to soups, stews, pasta, or rice. Add nuts or seeds for added healthy fats and plant protein at each meal. You can add these to yogurt, salads, or vegetable dishes. Marinate fish or vegetables using olive oil, lemon juice, garlic, and fresh herbs. Meal planning Plan to eat a vegetarian meal one day each week. Try to work up to two vegetarian meals, if possible. Eat  seafood two or more times a week. Have healthy snacks on hand. These may include: Vegetable sticks with hummus. Greek yogurt. Fruit and nut trail mix. Eat balanced meals. These should include: Fruit: 2-3 servings a day. Vegetables: 4-5 servings a day. Low-fat dairy: 2 servings a day. Fish, poultry, or lean meat: 1 serving a day. Beans and legumes: 2 or more servings a week. Nuts and seeds: 1-2 servings a day. Whole grains: 6-8 servings a day. Extra-virgin olive oil: 3-4 servings a day. Limit red meat and sweets to just a few servings a month. Lifestyle  Try to cook and eat meals with your family. Drink enough fluid to keep your pee (urine) pale yellow. Be active every day. This includes: Aerobic exercise, which is exercise that causes your heart to beat faster. Examples include running and swimming. Leisure activities like gardening, walking, or housework. Get 7-8 hours of sleep each night. Drink red wine if your provider says you can. A glass of wine is 5 oz (150 mL). You may be allowed to have: Up to 1 glass a day if you're male and not pregnant. Up to 2 glasses a day if you're male. What foods should I eat? Fruits Apples. Apricots. Avocado. Berries. Bananas. Cherries. Dates. Figs. Grapes. Lemons. Melon. Oranges. Peaches. Plums. Pomegranate. Vegetables Artichokes. Beets. Broccoli. Cabbage. Carrots. Eggplant. Green beans. Chard. Kale. Spinach. Onions. Leeks. Peas. Squash. Tomatoes. Peppers. Radishes. Grains Whole-grain pasta. Brown rice. Bulgur wheat. Polenta. Couscous. Whole-wheat bread. Orpah Cobb. Meats and other proteins Beans. Almonds. Sunflower seeds. Pine nuts. Peanuts. Cod. Salmon. Scallops. Shrimp. Tuna. Tilapia. Clams. Oysters. Eggs. Chicken or [place] without skin. Dairy Low-fat milk. Cheese. Greek yogurt. Fats and oils Extra-virgin olive oil. Avocado oil. Grapeseed oil. Beverages Water. Red wine. Herbal tea. Sweets and desserts Greek yogurt with honey.  Baked apples. Poached pears. Trail mix. Seasonings and condiments Basil. Cilantro. Coriander. Cumin. Mint. Parsley. Sage. Rosemary. Tarragon. Garlic. Oregano. Thyme. Pepper. Balsamic vinegar. Tahini. Hummus. Tomato sauce. Olives. Mushrooms. The items listed above may not be all the foods and drinks you can have. Talk to a dietitian to learn more. What foods should I limit? This is a list of foods that should be eaten rarely. Fruits Fruit canned in syrup. Vegetables Deep-fried potatoes, like [place] fries. Grains Packaged pasta or rice dishes. Cereal with added sugar. Snacks with added sugar. Meats and other proteins Beef. Pork. Lamb. Chicken or [place] with skin. Hot dogs. Tomasa Blase. Dairy Ice cream. Sour cream. Whole milk. Fats and oils Butter. Canola oil. Vegetable oil. Beef fat (tallow). Lard. Beverages Juice. Sugar-sweetened soft drinks. Beer. Liquor and spirits. Sweets and desserts Cookies. Cakes. Pies. Candy. Seasonings and condiments Mayonnaise. Pre-made sauces and marinades. The items listed above may  not be all the foods and drinks you should limit. Talk to a dietitian to learn more. Where to find more information American Heart Association (AHA): heart.org This information is not intended to replace advice given to you by your health care provider. Make sure you discuss any questions you have with your health care provider. Document Revised: 08/16/2022 Document Reviewed: 08/16/2022 Elsevier Patient Education  2024 [employer].

## 2023-07-16 DIAGNOSIS — I4892 Unspecified atrial flutter: Secondary | ICD-10-CM

## 2023-07-20 ENCOUNTER — Ambulatory Visit (HOSPITAL_COMMUNITY): Payer: Managed Care, Other (non HMO) | Attending: Cardiology

## 2023-07-20 DIAGNOSIS — I4892 Unspecified atrial flutter: Secondary | ICD-10-CM | POA: Diagnosis present

## 2023-07-20 LAB — ECHOCARDIOGRAM COMPLETE
Area-P 1/2: 3.31 cm2
S' Lateral: 3.5 cm

## 2023-07-21 ENCOUNTER — Encounter: Payer: Self-pay | Admitting: Cardiology

## 2023-08-05 ENCOUNTER — Telehealth: Payer: Self-pay

## 2023-08-05 NOTE — Telephone Encounter (Signed)
 Patient with diagnosis of DVT and A Flutter on Xarelto for anticoagulation.    Procedure: colonoscopy Date of procedure: 09/14/23   CHA2DS2-VASc Score = 0  This indicates a 0.2% annual risk of stroke. The patient's score is based upon: CHF History: 0 HTN History: 0 Diabetes History: 0 Stroke History: 0 Vascular Disease History: 0 Age Score: 0 Gender Score: 0      CrCl 99 ml/min Platelet count 260K   Per office protocol, patient can hold Xarelto for 2 days prior to procedure.    **This guidance is not considered finalized until pre-operative APP has relayed final recommendations.**

## 2023-08-05 NOTE — Telephone Encounter (Signed)
   Pre-operative Risk Assessment    Patient Name: [name] [name]  DOB: March 05, 1966 MRN: [id number]   Date of last office visit: 07/14/23 with Dr. Thomasene Ripple Date of next office visit: 10/14/23 with Dr. Thomasene Ripple   Request for Surgical Clearance    Procedure:   Colonoscopy   Date of Surgery:  Clearance 09/14/23                                 Surgeon:  Dr. Willis Modena Surgeon's Group or Practice Name:  [facility] Phone number:  [phone]  Fax number:  [phone]   Type of Clearance Requested:   - Medical  - Pharmacy:  Hold Rivaroxaban (Xarelto) not indicated    Type of Anesthesia:   propofol   Additional requests/questions:    Robley Fries   08/05/2023, 1:54 PM

## 2023-08-10 NOTE — Telephone Encounter (Signed)
 [name] [name],  You saw this patient on 07/14/2023. He wore a heart monitor which showed 2% afib burden. It looks like your plan is to refer him to EP. He also had an echo which showed normal LV function with grade I DD. Per office protocol, will you please comment on medical clearance for colonoscopy on 4/29?  Please route your response to P CV DIV Preop. I will communicate with requesting office once you have given recommendations.   Thank you!  Carlos Levering, NP

## 2023-08-19 ENCOUNTER — Ambulatory Visit: Payer: Managed Care, Other (non HMO) | Attending: Internal Medicine | Admitting: Pharmacist

## 2023-08-19 VITALS — BP 132/80 | HR 61

## 2023-08-19 DIAGNOSIS — I10 Essential (primary) hypertension: Secondary | ICD-10-CM

## 2023-08-19 NOTE — Patient Instructions (Signed)
 It was nice meeting you today  We would like your blood pressure to stay less than 130/80 and it is very close this morning  Please continue your lisinopril 20mg  and hydrochlorothiazide 25mg  once a day  Please continue your diltiazem 240mg  once a day  Continue to check your blood pressure at home. Continue healthy eating and exercise  I placed a lab order to check your kidney function which you can have done at your primary care office  Please send [place] a message with any questions  Laural Golden, PharmD, BCACP, CDCES, CPP [address], [address] [place], [place], [address] Phone: [phone], Fax: [phone]

## 2023-08-19 NOTE — Progress Notes (Signed)
 Patient ID: [name] [name]                 DOB: 1965-11-14                      MRN: [id number]     HPI: [name] [name] is a 58 y.o. male referred by Dr. Servando Salina to HTN clinic. PMH is significant for atrial flutter, history of DVT, HLD, elevated Scr, and knee/hip pain.   Patient misunderstood instructions and had been only taking hydrochlorothiazide up until a week ago when he realized he should be on lisinopril as well.   Patient presents today in good spirits. Reports he feels well. Has not noticed any A flutter symptoms but is nervous regarding his monitor report.   Does not add salt to his food although his wife does if she is preparing food. Walks for 4 miles every day. Previously ran but now is limited by knee pain. 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 Eats a Mediterranean diet. Has one cup of caffienated coffee in the morning.  He took his medications approximately 30 minutes prior to appointment. Brought home Omron cuff. Two in office readings using home cuff: 142/95, 147/95.  Looked at device memory on home cuff. Majority of readings ranging from 120-150.  Current HTN meds:  Diltiazem 240mg  daily Lisiniopril 20mg  (restarted a week ago) hydrochlorothiazide 25mg  daily  BP goal: <130/80   Wt Readings from Last 3 Encounters:  07/14/23 237 lb 12.8 oz (107.9 kg)  06/04/23 227 lb (103 kg)  10/26/22 246 lb (111.6 kg)   BP Readings from Last 3 Encounters:  07/14/23 (!) 152/98  06/04/23 (!) 145/96  10/27/22 109/68   Pulse Readings from Last 3 Encounters:  07/14/23 68  06/04/23 76  10/27/22 77    Renal function: CrCl cannot be calculated (Patient's most recent lab result is older than the maximum 21 days allowed.).  Past Medical History:  Diagnosis Date   Anxiety    Arthritis    DVT (deep venous thrombosis) (HCC)    GERD (gastroesophageal reflux disease)    History of hiatal hernia    Hyperlipidemia    Hypertension     Current Outpatient Medications on File  Prior to Visit  Medication Sig Dispense Refill   amoxicillin (AMOXIL) 500 MG tablet Take 4 tablets by mouth 1 hour prior to dental procedure. (Patient not taking: Reported on 07/14/2023) 4 tablet 2   atorvastatin (LIPITOR) 40 MG tablet Take 40 mg by mouth daily.     B Complex Vitamins (VITAMIN B COMPLEX PO) Take 1 tablet by mouth daily.     Cholecalciferol 125 MCG (5000 UT) capsule Take 5,000 Units by mouth daily. (Patient not taking: Reported on 07/14/2023)     diltiazem (CARDIZEM CD) 240 MG 24 hr capsule Take 1 capsule (240 mg total) by mouth daily. 90 capsule 3   docusate sodium (COLACE) 100 MG capsule Take 1 capsule (100 mg total) by mouth daily as needed. (Patient not taking: Reported on 07/14/2023) 30 capsule 2   hydrochlorothiazide (HYDRODIURIL) 12.5 MG tablet Take 12.5 mg by mouth daily. PATIENT TAKES 1/2 A TABLET DAILY.     lisinopril-hydrochlorothiazide (ZESTORETIC) 10-12.5 MG tablet Take 1 tablet by mouth daily. (Patient not taking: Reported on 07/14/2023)     methocarbamol (ROBAXIN-750) 750 MG tablet Take 1 tablet (750 mg total) by mouth 2 (two) times daily as needed for muscle spasms. (Patient not taking: Reported on 07/14/2023) 20 tablet 2  Multiple Vitamin (MULTIVITAMIN WITH MINERALS) TABS tablet Take 1 tablet by mouth daily.     NON FORMULARY Pt uses a cpap nightly     ondansetron (ZOFRAN) 4 MG tablet Take 1 tablet (4 mg total) by mouth every 8 (eight) hours as needed for nausea or vomiting. (Patient not taking: Reported on 07/14/2023) 40 tablet 0   oxyCODONE-acetaminophen (PERCOCET) 5-325 MG tablet Take 1-2 tablets by mouth every 6 (six) hours as needed. To be taken after surgery (Patient not taking: Reported on 07/14/2023) 40 tablet 0   sertraline (ZOLOFT) 50 MG tablet Take 50 mg by mouth daily.     tamsulosin (FLOMAX) 0.4 MG CAPS capsule Take 0.4 mg by mouth daily.     XARELTO 20 MG TABS tablet Take 20 mg by mouth daily with supper.     No current facility-administered medications on  file prior to visit.    No Known Allergies   Assessment/Plan:  1. Hypertension -    Patient BP in room 132/80 which is slightly above goal of <130/80. Patient's home cuff appears to read slightly above actual readings. Will not make any adjustments today as patient has recently taken meds and is likely not seeing effect yet. Recommended continued healthy eating, exercise, and self monitoring at home. Placed order for BMP due to restarting lisinopril although patient believes his PCP will be ordering also next week.    Continue: Lisinopril 20mg  daily Hydrochlorothiazide 25mg  daily Diltiazem 240mg  daily Check BMP F/u as needed  Laural Golden, PharmD, BCACP, CDCES, CPP [address], [address] [place], [place], [address] Phone: [phone], Fax: [phone]

## 2023-08-19 NOTE — Telephone Encounter (Signed)
   Name: [name] [name]  DOB: [birth year]  MRN: [id number]   Primary Cardiologist: None  Chart reviewed as part of pre-operative protocol coverage. [name] [name] was last seen on 07/14/2023 by Dr. Servando Salina.  Per Dr. Servando Salina "He can proceed with his colonoscopy.  Xarelto can be held for 2 days prior to procedure, and restarted postprocedure at the discretion of the GI doctor if need to delay before starting."  Therefore, based on ACC/AHA guidelines, the patient would be an acceptable risk for the planned procedure without further cardiovascular testing.   Per Pharm D and Dr. Servando Salina, patient may hold Xarelto for 2 days prior to procedure.    I will route this recommendation to the requesting party via Epic fax function and remove from pre-op pool. Please call with questions.  Carlos Levering, NP 08/19/2023, 7:52 AM

## 2023-08-20 ENCOUNTER — Encounter: Payer: Self-pay | Admitting: Pharmacist

## 2023-09-06 ENCOUNTER — Encounter: Payer: Self-pay | Admitting: Cardiology

## 2023-09-08 ENCOUNTER — Telehealth: Payer: Self-pay

## 2023-09-08 NOTE — Telephone Encounter (Signed)
   Pre-operative Risk Assessment    Patient Name: [name] [name]  DOB: April 02, 1966 MRN: [id number]   Date of last office visit: 08/19/23 Sunny English, [facility] Date of next office visit: 09/21/23 [facility] TODD, DO   Request for Surgical Clearance    Procedure:   COLONOSCOPY  Date of Surgery:  Clearance 10/28/23                                Surgeon:  DR. Evangeline Hilts Surgeon's Group or Practice Name:  [facility] Phone number:  [phone] Fax number:  [phone]   Type of Clearance Requested:   - Medical    Type of Anesthesia:   PROPOFOL    Additional requests/questions:    SignedCollin Deal   09/08/2023, 11:35 AM

## 2023-09-08 NOTE — Telephone Encounter (Signed)
   Name: [name] [name]  DOB: [birth year]  MRN: [id number]  Primary Cardiologist: None  Chart reviewed as part of pre-operative protocol coverage. The patient has an upcoming visit scheduled with Dr. Emmette Harms on 09/21/23 at which time clearance can be addressed in case there are any issues that would impact surgical recommendations.  Colonoscopy is not scheduled until 10/28/23 as below. I added preop FYI to appointment note so that provider is aware to address at time of outpatient visit.  Per office protocol the cardiology provider should forward their finalized clearance decision and recommendations regarding antiplatelet therapy to the requesting party below.    I will route this message as FYI to requesting party and remove this message from the preop box as separate preop APP input not needed at this time.   Please call with any questions.  Sumit Branham D Ashtynn Berke, NP  09/08/2023, 11:48 AM

## 2023-09-21 ENCOUNTER — Encounter: Payer: Self-pay | Admitting: Cardiology

## 2023-09-21 ENCOUNTER — Ambulatory Visit: Attending: Cardiology | Admitting: Cardiology

## 2023-09-21 VITALS — BP 140/82 | HR 60 | Ht 72.0 in | Wt 238.0 lb

## 2023-09-21 DIAGNOSIS — I4891 Unspecified atrial fibrillation: Secondary | ICD-10-CM

## 2023-09-21 DIAGNOSIS — Z01818 Encounter for other preprocedural examination: Secondary | ICD-10-CM | POA: Diagnosis not present

## 2023-09-21 DIAGNOSIS — E7849 Other hyperlipidemia: Secondary | ICD-10-CM

## 2023-09-21 DIAGNOSIS — I10 Essential (primary) hypertension: Secondary | ICD-10-CM

## 2023-09-21 DIAGNOSIS — I4892 Unspecified atrial flutter: Secondary | ICD-10-CM | POA: Diagnosis not present

## 2023-09-21 NOTE — Progress Notes (Signed)
 Cardiology Office Note:    Date:  09/21/2023   ID:  [name] [name], DOB 1965-05-30, MRN [id number]  PCP:  Lanae Pinal, MD  Cardiologist:  Jerryl Morin, DO  Electrophysiologist:  None   Referring MD: Lanae Pinal, MD   " I am doing well"  History of Present Illness:    [name] [name] is a 58 y.o. male with a hx of atrial flutter and noted to be atrial fibrillation.  On recent ZIO monitor on Cardizem , on Xeralto for DVT, Sleep apnea on CPAP, elevated creatinine suspicious for kidney disease stage I  At his last visit with me and things to monitor the patient we showed paroxysmal atrial fibrillation as well as some short beats of atrial flutter.  His biggest complaint today is the fact that his blood pressure is dropping after he exercises.  This has been going on since he started the hydrochlorothiazide .   Past Medical History:  Diagnosis Date   Anxiety    Arthritis    DVT (deep venous thrombosis) (HCC)    GERD (gastroesophageal reflux disease)    History of hiatal hernia    Hyperlipidemia    Hypertension     Past Surgical History:  Procedure Laterality Date   COLONOSCOPY  2016   INGUINAL HERNIA REPAIR Right 2021   TOTAL HIP ARTHROPLASTY Right 10/26/2022   Procedure: RIGHT TOTAL HIP ARTHROPLASTY ANTERIOR APPROACH;  Surgeon: Wes Hamman, MD;  Location: [facility] OR;  Service: Orthopedics;  Laterality: Right;  3-C   UMBILICAL HERNIA REPAIR      Current Medications: Current Meds  Medication Sig   amoxicillin  (AMOXIL ) 500 MG tablet Take 4 tablets by mouth 1 hour prior to dental procedure.   atorvastatin (LIPITOR) 40 MG tablet Take 40 mg by mouth daily.   B Complex Vitamins (VITAMIN B COMPLEX PO) Take 1 tablet by mouth daily.   Cholecalciferol 125 MCG (5000 UT) capsule Take 5,000 Units by mouth daily. Taking Monday, Wednesday and Friday   diltiazem  (CARDIZEM  CD) 240 MG 24 hr capsule Take 1 capsule (240 mg total) by mouth daily.   docusate sodium  (COLACE) 100 MG capsule Take  1 capsule (100 mg total) by mouth daily as needed.   hydrochlorothiazide  (HYDRODIURIL ) 25 MG tablet Take 25 mg by mouth every morning.   lisinopril  (ZESTRIL ) 20 MG tablet Take 20 mg by mouth daily.   Multiple Vitamin (MULTIVITAMIN WITH MINERALS) TABS tablet Take 1 tablet by mouth daily.   NON FORMULARY Pt uses a cpap nightly   sertraline  (ZOLOFT ) 50 MG tablet Take 50 mg by mouth daily.   tamsulosin  (FLOMAX ) 0.4 MG CAPS capsule Take 0.4 mg by mouth daily.   XARELTO  20 MG TABS tablet Take 20 mg by mouth daily with supper.     Allergies:   Patient has no known allergies.   Social History   Socioeconomic History   Marital status: Married    Spouse name: Not on file   Number of children: Not on file   Years of education: Not on file   Highest education level: Not on file  Occupational History   Not on file  Tobacco Use   Smoking status: Never   Smokeless tobacco: Never  Vaping Use   Vaping status: Never Used  Substance and Sexual Activity   Alcohol use: Yes    Alcohol/week: 4.0 - 5.0 standard drinks of alcohol    Types: 4 - 5 Standard drinks or equivalent per week   Drug use: No  Sexual activity: Yes  Other Topics Concern   Not on file  Social History Narrative   Not on file   Social Drivers of Health   Financial Resource Strain: Not on file  Food Insecurity: No Food Insecurity (10/26/2022)   Hunger Vital Sign    Worried About Running Out of Food in the Last Year: Never true    Ran Out of Food in the Last Year: Never true  Transportation Needs: No Transportation Needs (10/26/2022)   PRAPARE - Administrator, Civil Service (Medical): No    Lack of Transportation (Non-Medical): No  Physical Activity: Not on file  Stress: Not on file  Social Connections: Not on file     Family History: The patient's family history includes Stroke in his father.  ROS:   Review of Systems  Constitution: Negative for decreased appetite, fever and weight gain.  HENT: Negative  for congestion, ear discharge, hoarse voice and sore throat.   Eyes: Negative for discharge, redness, vision loss in right eye and visual halos.  Cardiovascular: Negative for chest pain, dyspnea on exertion, leg swelling, orthopnea and palpitations.  Respiratory: Negative for cough, hemoptysis, shortness of breath and snoring.   Endocrine: Negative for heat intolerance and polyphagia.  Hematologic/Lymphatic: Negative for bleeding problem. Does not bruise/bleed easily.  Skin: Negative for flushing, nail changes, rash and suspicious lesions.  Musculoskeletal: Negative for arthritis, joint pain, muscle cramps, myalgias, neck pain and stiffness.  Gastrointestinal: Negative for abdominal pain, bowel incontinence, diarrhea and excessive appetite.  Genitourinary: Negative for decreased libido, genital sores and incomplete emptying.  Neurological: Negative for brief paralysis, focal weakness, headaches and loss of balance.  Psychiatric/Behavioral: Negative for altered mental status, depression and suicidal ideas.  Allergic/Immunologic: Negative for HIV exposure and persistent infections.    EKGs/Labs/Other Studies Reviewed:    The following studies were reviewed today:   EKG:  The ekg ordered today demonstrates sinus rhythm, heart rate 60 bpm.  Recent Labs: 06/04/2023: BUN 21; Creatinine, Ser 1.21; Hemoglobin 15.1; Platelets 260; Potassium 4.5; Sodium 140; TSH 2.128  Recent Lipid Panel No results found for: "CHOL", "TRIG", "HDL", "CHOLHDL", "VLDL", "LDLCALC", "LDLDIRECT"  Physical Exam:    VS:  BP (!) 140/82 (BP Location: Right Arm, Patient Position: Sitting, Cuff Size: Normal)   Pulse 60   Ht 6' (1.829 m)   Wt 238 lb (108 kg)   BMI 32.28 kg/m     Wt Readings from Last 3 Encounters:  09/21/23 238 lb (108 kg)  07/14/23 237 lb 12.8 oz (107.9 kg)  06/04/23 227 lb (103 kg)     GEN: Well nourished, well developed in no acute distress HEENT: Normal NECK: No JVD; No carotid  bruits LYMPHATICS: No lymphadenopathy CARDIAC: S1S2 noted,RRR, no murmurs, rubs, gallops RESPIRATORY:  Clear to auscultation without rales, wheezing or rhonchi  ABDOMEN: Soft, non-tender, non-distended, +bowel sounds, no guarding. EXTREMITIES: No edema, No cyanosis, no clubbing MUSCULOSKELETAL:  No deformity  SKIN: Warm and dry NEUROLOGIC:  Alert and oriented x 3, non-focal PSYCHIATRIC:  Normal affect, good insight  ASSESSMENT:    1. Pre-operative clearance   2. Atrial flutter, unspecified type (HCC)   3. Atrial fibrillation, unspecified type (HCC)   4. Hypertension, unspecified type   5. Other hyperlipidemia    PLAN:    Atrial Flutter/atrial fibrillation-he is in sinus rhythm today by his EKG.  Will continue the current dose of Cardizem  to 40 mg.  He is on Xarelto  which he has been taking for his DVT.  His CHADS2 Vasc score  is 1 I think he is a great candidate for rhythm control he needs to discuss with EP going to refer him.  His monitor did have atrial fibrillation but some short beats of atrial flutter as well.  Hypertension Blood pressure elevated at today's visit.  He has been on lisinopril  which was switched from lisinopril  to hydrochlorothiazide  recently.  But he tells me that now when he exercises or go for a walk his blood pressure seem to be dropping closer to the low 100s. We are going to monitor this closely.  I have asked the patient to take his blood pressure daily for me.  If this is the case we will have to cut back on his diuretic as this may be causing this issue.  He has never experienced it before. He is planning to travel to [place] and wishes to stay with his whole medication regimen until he gets back.  But I have advised the patient to take his blood pressure daily.  While in [place] and after walks in [place] if his blood pressure dropped he needs to hold hydrochlorothiazide  until he gets back to the [place]  and also sent me a message.  He is planning for colonoscopy.   We discussed that he can hold his Xarelto  for 2 days prior to procedure and get right back on it.  Despite this is not for his atrial fibrillation - he take Xarelto  for his DVT which is being managed by his primary physician.    Chronic Kidney Disease Recent labs indicate potential chronic kidney disease. Patient was previously on Lisinopril , now on Hydrochlorothiazide . Avoid nephro toxins  OSA - continue with CPA  The patient is in agreement with the above plan. The patient left the office in stable condition.  The patient will follow up in 6 months or sooner if needed   Medication Adjustments/Labs and Tests Ordered: Current medicines are reviewed at length with the patient today.  Concerns regarding medicines are outlined above.  Orders Placed This Encounter  Procedures   Ambulatory referral to Cardiac Electrophysiology   EKG 12-Lead   No orders of the defined types were placed in this encounter.   Patient Instructions  Medication Instructions:  Your physician recommends that you continue on your current medications as directed. Please refer to the Current Medication list given to you today.  *If you need a refill on your cardiac medications before your next appointment, please call your pharmacy*  Follow-Up: At [facility], you and your health needs are our priority.  As part of our continuing mission to provide you with exceptional heart care, our providers are all part of one team.  This team includes your primary Cardiologist (physician) and Advanced Practice Providers or APPs (Physician Assistants and Nurse Practitioners) who all work together to provide you with the care you need, when you need it.  Your next appointment:   6 month(s)  Provider:   Ottie Neglia, DO    Other Instructions Stop Xarelto  2 days before your procedure (total of 2 doses). Restart minimal 12 hours after your procedure at the discretion of the physician preforming the procedure.   A  referral for EP has been placed. Please make an appointment with Dr. Lawana Pray.    Adopting a Healthy Lifestyle.  Know what a healthy weight is for you (roughly BMI <25) and aim to maintain this   Aim for 7+ servings of fruits and vegetables daily   65-80+ fluid ounces of  water or unsweet tea for healthy kidneys   Limit to max 1 drink of alcohol per day; avoid smoking/tobacco   Limit animal fats in diet for cholesterol and heart health - choose grass fed whenever available   Avoid highly processed foods, and foods high in saturated/trans fats   Aim for low stress - take time to unwind and care for your mental health   Aim for 150 min of moderate intensity exercise weekly for heart health, and weights twice weekly for bone health   Aim for 7-9 hours of sleep daily   When it comes to diets, agreement about the perfect plan isnt easy to find, even among the experts. Experts at the [facility] of [employer] developed an idea known as the Healthy Eating Plate. Just imagine a plate divided into logical, healthy portions.   The emphasis is on diet quality:   Load up on vegetables and fruits - one-half of your plate: Aim for color and variety, and remember that potatoes dont count.   Go for whole grains - one-quarter of your plate: Whole wheat, barley, wheat berries, quinoa, oats, brown rice, and foods made with them. If you want pasta, go with whole wheat pasta.   Protein power - one-quarter of your plate: Fish, chicken, beans, and nuts are all healthy, versatile protein sources. Limit red meat.   The diet, however, does go beyond the plate, offering a few other suggestions.   Use healthy plant oils, such as olive, canola, soy, corn, sunflower and peanut. Check the labels, and avoid partially hydrogenated oil, which have unhealthy trans fats.   If youre thirsty, drink water. Coffee and tea are good in moderation, but skip sugary drinks and limit milk and dairy products to one or  two daily servings.   The type of carbohydrate in the diet is more important than the amount. Some sources of carbohydrates, such as vegetables, fruits, whole grains, and beans-are healthier than others.   Finally, stay active  Signed, Jeanette Moffatt, DO  09/21/2023 9:53 AM    [facility]

## 2023-09-21 NOTE — Patient Instructions (Addendum)
 Medication Instructions:  Your physician recommends that you continue on your current medications as directed. Please refer to the Current Medication list given to you today.  *If you need a refill on your cardiac medications before your next appointment, please call your pharmacy*  Follow-Up: At [facility], you and your health needs are our priority.  As part of our continuing mission to provide you with exceptional heart care, our providers are all part of one team.  This team includes your primary Cardiologist (physician) and Advanced Practice Providers or APPs (Physician Assistants and Nurse Practitioners) who all work together to provide you with the care you need, when you need it.  Your next appointment:   6 month(s)  Provider:   Kardie Tobb, DO    Other Instructions Stop Xarelto  2 days before your procedure (total of 2 doses). Restart minimal 12 hours after your procedure at the discretion of the physician preforming the procedure.   A referral for EP has been placed. Please make an appointment with Dr. Lawana Pray.

## 2023-10-14 ENCOUNTER — Ambulatory Visit: Payer: Managed Care, Other (non HMO) | Admitting: Cardiology

## 2023-11-03 ENCOUNTER — Ambulatory Visit: Attending: Cardiology | Admitting: Cardiology

## 2023-11-03 ENCOUNTER — Encounter: Payer: Self-pay | Admitting: Cardiology

## 2023-11-03 VITALS — BP 128/90 | HR 60 | Ht 72.0 in | Wt 234.0 lb

## 2023-11-03 DIAGNOSIS — Z01812 Encounter for preprocedural laboratory examination: Secondary | ICD-10-CM

## 2023-11-03 DIAGNOSIS — I480 Paroxysmal atrial fibrillation: Secondary | ICD-10-CM | POA: Diagnosis not present

## 2023-11-03 DIAGNOSIS — I10 Essential (primary) hypertension: Secondary | ICD-10-CM

## 2023-11-03 MED ORDER — FLECAINIDE ACETATE 50 MG PO TABS: 50.0000 mg | ORAL_TABLET | Freq: Two times a day (BID) | ORAL | 6 refills | Status: DC

## 2023-11-03 NOTE — Patient Instructions (Addendum)
 Medication Instructions:  Your physician has recommended you make the following change in your medication:  START Flecainide 50 mg twice a day  *If you need a refill on your cardiac medications before your next appointment, please call your pharmacy*   Lab Work: Pre procedure labs -- we will call you to schedule:  BMP & CBC  If you have a lab test that is abnormal and we need to change your treatment, we will call you to review the results -- otherwise no news is good news.    Testing/Procedures: Your physician has requested that you have cardiac CT 3 weeks PRIOR to your ablation. Cardiac computed tomography (CT) is a painless test that uses an x-ray machine to take clear, detailed pictures of your heart. We will contact you if the result is abnormal. We will call you to schedule.  Your physician has recommended that you have an ablation. Catheter ablation is a medical procedure used to treat some cardiac arrhythmias (irregular heartbeats). During catheter ablation, a long, thin, flexible tube is put into a blood vessel in your groin (upper thigh), or neck. This tube is called an ablation catheter. It is then guided to your heart through the blood vessel. Radio frequency waves destroy small areas of heart tissue where abnormal heartbeats may cause an arrhythmia to start.   Your ablation is scheduled for 02/08/2024. Please arrive at [facility] at 5:30  am.  We will call/send instructions at a later date.   Follow-Up: At [facility], you and your health needs are our priority.  As part of our continuing mission to provide you with exceptional heart care, we have created designated Provider Care Teams.  These Care Teams include your primary Cardiologist (physician) and Advanced Practice Providers (APPs -  Physician Assistants and Nurse Practitioners) who all work together to provide you with the care you need, when you need it.   Your physician recommends that you schedule a  follow-up appointment in: 2 weeks for a nurse visit EKG.   Your next appointment:   1 month(s) after your ablation  The format for your next appointment:   In Person  Provider:   [facility]   Thank you for choosing [facility]!!   Reece Cane, RN [phone]    Other Instructions   Cardiac Ablation Cardiac ablation is a procedure to destroy (ablate) some heart tissue that is sending bad signals. These bad signals cause problems in heart rhythm. The heart has many areas that make these signals. If there are problems in these areas, they can make the heart beat in a way that is not normal. Destroying some tissues can help make the heart rhythm normal. Tell your doctor about: Any allergies you have. All medicines you are taking. These include vitamins, herbs, eye drops, creams, and over-the-counter medicines. Any problems you or family members have had with medicines that make you fall asleep (anesthetics). Any blood disorders you have. Any surgeries you have had. Any medical conditions you have, such as kidney failure. Whether you are pregnant or may be pregnant. What are the risks? This is a safe procedure. But problems may occur, including: Infection. Bruising and bleeding. Bleeding into the chest. Stroke or blood clots. Damage to nearby areas of your body. Allergies to medicines or dyes. The need for a pacemaker if the normal system is damaged. Failure of the procedure to treat the problem. What happens before the procedure? Medicines Ask your doctor about: Changing or stopping your normal  medicines. This is important. Taking aspirin  and ibuprofen. Do not take these medicines unless your doctor tells you to take them. Taking other medicines, vitamins, herbs, and supplements. General instructions Follow instructions from your doctor about what you cannot eat or drink. Plan to have someone take you home from the hospital or clinic. If you will be going  home right after the procedure, plan to have someone with you for 24 hours. Ask your doctor what steps will be taken to prevent infection. What happens during the procedure?  An IV tube will be put into one of your veins. You will be given a medicine to help you relax. The skin on your neck or groin will be numbed. A cut (incision) will be made in your neck or groin. A needle will be put through your cut and into a large vein. A tube (catheter) will be put into the needle. The tube will be moved to your heart. Dye may be put through the tube. This helps your doctor see your heart. Small devices (electrodes) on the tube will send out signals. A type of energy will be used to destroy some heart tissue. The tube will be taken out. Pressure will be held on your cut. This helps stop bleeding. A bandage will be put over your cut. The exact procedure may vary among doctors and hospitals. What happens after the procedure? You will be watched until you leave the hospital or clinic. This includes checking your heart rate, breathing rate, oxygen, and blood pressure. Your cut will be watched for bleeding. You will need to lie still for a few hours. Do not drive for 24 hours or as long as your doctor tells you. Summary Cardiac ablation is a procedure to destroy some heart tissue. This is done to treat heart rhythm problems. Tell your doctor about any medical conditions you may have. Tell him or her about all medicines you are taking to treat them. This is a safe procedure. But problems may occur. These include infection, bruising, bleeding, and damage to nearby areas of your body. Follow what your doctor tells you about food and drink. You may also be told to change or stop some of your medicines. After the procedure, do not drive for 24 hours or as long as your doctor tells you. This information is not intended to replace advice given to you by your health care provider. Make sure you discuss any  questions you have with your health care provider. Document Revised: 07/25/2021 Document Reviewed: 04/06/2019 Elsevier Patient Education  2023 Elsevier Inc[employer]   Cardiac Ablation, Care After  This sheet gives you information about how to care for yourself after your procedure. Your health care provider may also give you more specific instructions. If you have problems or questions, contact your health care provider. What can I expect after the procedure? After the procedure, it is common to have: Bruising around your puncture site. Tenderness around your puncture site. Skipped heartbeats. If you had an atrial fibrillation ablation, you may have atrial fibrillation during the first several months after your procedure.  Tiredness (fatigue).  Follow these instructions at home: Puncture site care  Follow instructions from your health care provider about how to take care of your puncture site. Make sure you: If present, leave stitches (sutures), skin glue, or adhesive strips in place. These skin closures may need to stay in place for up to 2 weeks. If adhesive strip edges start to loosen and curl up, you may trim  the loose edges. Do not remove adhesive strips completely unless your health care provider tells you to do that. If a large square bandage is present, this may be removed 24 hours after surgery.  Check your puncture site every day for signs of infection. Check for: Redness, swelling, or pain. Fluid or blood. If your puncture site starts to bleed, lie down on your back, apply firm pressure to the area, and contact your health care provider. Warmth. Pus or a bad smell. A pea or small marble sized lump at the site is normal and can take up to three months to resolve.  Driving Do not drive for at least 4 days after your procedure or however long your health care provider recommends. (Do not resume driving if you have previously been instructed not to drive for other health reasons.) Do not  drive or use heavy machinery while taking prescription pain medicine. Activity Avoid activities that take a lot of effort for at least 7 days after your procedure. Do not lift anything that is heavier than 5 lb (4.5 kg) for one week.  No sexual activity for 1 week.  Return to your normal activities as told by your health care provider. Ask your health care provider what activities are safe for you. General instructions Take over-the-counter and prescription medicines only as told by your health care provider. Do not use any products that contain nicotine or tobacco, such as cigarettes and e-cigarettes. If you need help quitting, ask your health care provider. You may shower after 24 hours, but Do not take baths, swim, or use a hot tub for 1 week.  Do not drink alcohol for 24 hours after your procedure. Keep all follow-up visits as told by your health care provider. This is important. Contact a health care provider if: You have redness, mild swelling, or pain around your puncture site. You have fluid or blood coming from your puncture site that stops after applying firm pressure to the area. Your puncture site feels warm to the touch. You have pus or a bad smell coming from your puncture site. You have a fever. You have chest pain or discomfort that spreads to your neck, jaw, or arm. You have chest pain that is worse with lying on your back or taking a deep breath. You are sweating a lot. You feel nauseous. You have a fast or irregular heartbeat. You have shortness of breath. You are dizzy or light-headed and feel the need to lie down. You have pain or numbness in the arm or leg closest to your puncture site. Get help right away if: Your puncture site suddenly swells. Your puncture site is bleeding and the bleeding does not stop after applying firm pressure to the area. These symptoms may represent a serious problem that is an emergency. Do not wait to see if the symptoms will go away.  Get medical help right away. Call your local emergency services (911 in the [place] Do not drive yourself to the hospital. Summary After the procedure, it is normal to have bruising and tenderness at the puncture site in your groin, neck, or forearm. Check your puncture site every day for signs of infection. Get help right away if your puncture site is bleeding and the bleeding does not stop after applying firm pressure to the area. This is a medical emergency. This information is not intended to replace advice given to you by your health care provider. Make sure you discuss any questions you have with your  health care provider.

## 2023-11-03 NOTE — Progress Notes (Signed)
 Electrophysiology Office Note:   Date:  11/03/2023  ID:  [name], DOB [birth year], MRN [id number]  Primary Cardiologist: Jerryl Morin, DO Primary Heart Failure: None Electrophysiologist: None      History of Present Illness:   [name] [name] is a 58 y.o. male with h/o atrial for 5 atrial flutter, PE, hypertension, hyperlipidemia seen today for  for Electrophysiology evaluation of atrial fibrillation at the request of Kardit Tobb.   He presented to cardiology clinic with intermittent palpitations and fluttering.  These occur with physical activity but also when lying in bed.  He has also had episodic dizziness.  He went to the emergency room at 1 point and was noted to be in atrial flutter.  Today, denies symptoms of palpitations, chest pain, dyspnea, orthopnea, PND, lower extremity edema, claudication, dizziness, presyncope, syncope, bleeding, or neurologic sequela. The patient is tolerating medications without difficulties.  He feels well today.  When he is in atrial fibrillation, he feels some mild fatigue, shortness of breath, palpitations.  His blood pressure also goes down when he is somewhat dehydrated.  When he is in normal rhythm, he is able to exercise and do all of his daily activities.  Review of systems complete and found to be negative unless listed in HPI.   EP Information / Studies Reviewed:    EKG is not ordered today. EKG from 09/21/2023 reviewed which showed sinus rhythm        Risk Assessment/Calculations:    CHA2DS2-VASc Score = 1   This indicates a 0.6% annual risk of stroke. The patient's score is based upon: CHF History: 0 HTN History: 1 Diabetes History: 0 Stroke History: 0 Vascular Disease History: 0 Age Score: 0 Gender Score: 0         Physical Exam:   VS:  BP (!) 128/90 (BP Location: Right Arm, Patient Position: Sitting, Cuff Size: Large)   Pulse 60   Ht 6' (1.829 m)   Wt 234 lb (106.1 kg)   SpO2 95%   BMI 31.74 kg/m    Wt Readings from  Last 3 Encounters:  11/03/23 234 lb (106.1 kg)  09/21/23 238 lb (108 kg)  07/14/23 237 lb 12.8 oz (107.9 kg)     GEN: Well nourished, well developed in no acute distress NECK: No JVD; No carotid bruits CARDIAC: Regular rate and rhythm, no murmurs, rubs, gallops RESPIRATORY:  Clear to auscultation without rales, wheezing or rhonchi  ABDOMEN: Soft, non-tender, non-distended EXTREMITIES:  No edema; No deformity   ASSESSMENT AND PLAN:    1.  Paroxysmal atrial fibrillation/flutter: On Xarelto .  Inette Doubrava not need lifelong as his stroke risk is low.  We discussed rhythm management for his atrial fibrillation as he feels quite poorly when he is out of rhythm.  We discussed flecainide versus ablation.  At this point, he would like to avoid long-term antiarrhythmics.  Due to that, we Dejane Scheibe plan for ablation.  Risk, benefits, and alternatives to EP study and radiofrequency/pulse field ablation for afib were also discussed in detail today. These risks include but are not limited to stroke, bleeding, vascular damage, tamponade, perforation, damage to the esophagus, lungs, and other structures, pulmonary vein stenosis, worsening renal function, and death. The patient understands these risk and wishes to proceed.  We Winola Drum therefore proceed with catheter ablation at the next available time.  Carto, ICE, anesthesia are requested for the procedure.  Gayl Ivanoff also obtain CT PV protocol prior to the procedure to exclude LAA thrombus and further evaluate atrial  anatomy.  2.  Hypertension: Well-controlled  Follow up with Afib Clinic as usual post procedure  Signed, De Libman Cortland Ding, MD

## 2023-11-17 ENCOUNTER — Ambulatory Visit: Attending: Cardiology | Admitting: *Deleted

## 2023-11-17 VITALS — BP 122/78 | Ht 72.0 in | Wt 241.0 lb

## 2023-11-17 DIAGNOSIS — Z5181 Encounter for therapeutic drug level monitoring: Secondary | ICD-10-CM | POA: Diagnosis not present

## 2023-11-17 DIAGNOSIS — I4891 Unspecified atrial fibrillation: Secondary | ICD-10-CM | POA: Diagnosis not present

## 2023-11-17 DIAGNOSIS — I480 Paroxysmal atrial fibrillation: Secondary | ICD-10-CM

## 2023-11-17 NOTE — Progress Notes (Signed)
   Nurse Visit   Date of Encounter: 11/17/2023 ID: Dallas JINNY Berber, DOB May 14, 1966, MRN [id number]  PCP:  Regino Slater, MD   [facility] Providers Cardiologist:  Dub Huntsman, DO Dr     Visit Details   VS:  BP 122/78 (BP Location: Right Arm, Patient Position: Sitting, Cuff Size: Normal)   Ht 6' (1.829 m)   Wt 241 lb (109.3 kg)   BMI 32.69 kg/m  , BMI Body mass index is 32.69 kg/m.  Wt Readings from Last 3 Encounters:  11/17/23 241 lb (109.3 kg)  11/03/23 234 lb (106.1 kg)  09/21/23 238 lb (108 kg)     Reason for visit: EKG Performed today: Vitals, EKG, Provider consulted:Dr Swaziland, and Education Changes (medications, testing, etc.) : no change Length of Visit: 20 minutes    Medications Adjustments/Labs and Tests Ordered: Orders Placed This Encounter  Procedures   EKG 12-Lead   No orders of the defined types were placed in this encounter.  EKG reviewed by Dr Swaziland.  Patient to continue current medications  Signed, Avelina Felix, RN  11/17/2023 3:12 PM

## 2023-11-26 ENCOUNTER — Ambulatory Visit: Admitting: Orthopaedic Surgery

## 2023-11-30 ENCOUNTER — Ambulatory Visit: Admitting: Orthopaedic Surgery

## 2023-11-30 ENCOUNTER — Other Ambulatory Visit (INDEPENDENT_AMBULATORY_CARE_PROVIDER_SITE_OTHER): Payer: Self-pay

## 2023-11-30 DIAGNOSIS — M1612 Unilateral primary osteoarthritis, left hip: Secondary | ICD-10-CM

## 2023-11-30 NOTE — Progress Notes (Signed)
 Office Visit Note   Patient: [name] [name]           Date of Birth: 11-11-1965           MRN: [id number] Visit Date: 11/30/2023              Requested by: Regino Slater, MD [address] [place],  [place] [address] PCP: Regino Slater, MD   Assessment & Plan: Visit Diagnoses:  1. Primary osteoarthritis of left hip     Plan: History of Present Illness The patient presents with left hip pain and is considering hip replacement surgery.  They experience left hip pain located in the groin and outer side of the hip, with occasional 'giving way' once or twice a week. The discomfort is increasing, although they continue to walk daily. They have a history of right hip issues, for which they underwent surgery, but there has been no recent follow-up for the right hip since the initial postoperative period.  Results RADIOLOGY Hip X-ray: Severe degenerative changes, joint space narrowing with bone-on-bone contact, large subchondral cysts, subchondral sclerosis  Assessment and Plan Severe left hip osteoarthritis Severe degenerative changes with bone-on-bone joint space narrowing, large cysts, and subchondral sclerosis. Radiographic markers indicate advanced osteoarthritis. Hip replacement surgery decision depends on symptom severity and readiness. Coordination with cardiologist required due to upcoming atrial fibrillation ablation. - Coordinate with cardiologist regarding timing of hip replacement surgery post-ablation. - Contact through MyChart to schedule surgery after cardiologist approval.  Atrial fibrillation Scheduled for ablation in September. Coordination with cardiologist necessary for hip replacement timing post-ablation. - patient will communicate with cardiologist to confirm timing of hip replacement surgery post-ablation.  Follow-Up Instructions: No follow-ups on file.   Orders:  Orders Placed This Encounter  Procedures   XR HIP UNILAT W OR W/O PELVIS 2-3  VIEWS LEFT   No orders of the defined types were placed in this encounter.     Procedures: No procedures performed   Clinical Data: No additional findings.   Subjective: Chief Complaint  Patient presents with   Left Hip - Pain    HPI  Review of Systems  Constitutional: Negative.   HENT: Negative.    Eyes: Negative.   Respiratory: Negative.    Cardiovascular: Negative.   Gastrointestinal: Negative.   Endocrine: Negative.   Genitourinary: Negative.   Skin: Negative.   Allergic/Immunologic: Negative.   Neurological: Negative.   Hematological: Negative.   Psychiatric/Behavioral: Negative.    All other systems reviewed and are negative.    Objective: Vital Signs: There were no vitals taken for this visit.  Physical Exam Vitals and nursing note reviewed.  Constitutional:      Appearance: He is well-developed.  HENT:     Head: Normocephalic and atraumatic.  Eyes:     Pupils: Pupils are equal, round, and reactive to light.  Pulmonary:     Effort: Pulmonary effort is normal.  Abdominal:     Palpations: Abdomen is soft.  Musculoskeletal:        General: Normal range of motion.     Cervical back: Neck supple.  Skin:    General: Skin is warm.  Neurological:     Mental Status: He is alert and oriented to person, place, and time.  Psychiatric:        Behavior: Behavior normal.        Thought Content: Thought content normal.        Judgment: Judgment normal.     Ortho Exam  Specialty Comments:  No specialty comments available.  Imaging: No results found.   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) 10/19/2016   Acute venous embolism and thrombosis of deep vessels of distal lower extremity (HCC) 06/23/2011   Phlebitis and thrombophlebitis of superficial vessels of lower extremities 06/23/2011   Past Medical History:  Diagnosis Date   Anxiety    Arthritis    DVT (deep venous thrombosis) (HCC)    GERD (gastroesophageal reflux disease)    History of hiatal hernia    Hyperlipidemia    Hypertension     Family History  Problem Relation Age of Onset   Stroke Father     Past Surgical History:  Procedure Laterality Date   COLONOSCOPY  2016   INGUINAL HERNIA REPAIR Right 2021   TOTAL HIP ARTHROPLASTY Right 10/26/2022   Procedure: RIGHT TOTAL HIP ARTHROPLASTY ANTERIOR APPROACH;  Surgeon: Jerri Kay HERO, MD;  Location: [facility] OR;  Service: Orthopedics;  Laterality: Right;  3-C   UMBILICAL HERNIA REPAIR     Social History   Occupational History   Not on file  Tobacco Use   Smoking status: Never   Smokeless tobacco: Never  Vaping Use   Vaping status: Never Used  Substance and Sexual Activity   Alcohol use: Yes    Alcohol/week: 4.0 - 5.0 standard drinks of alcohol    Types: 4 - 5 Standard drinks or equivalent per week   Drug use: No   Sexual activity: Yes

## 2023-12-06 ENCOUNTER — Encounter: Payer: Self-pay | Admitting: Cardiology

## 2023-12-06 ENCOUNTER — Encounter: Payer: Self-pay | Admitting: Orthopaedic Surgery

## 2023-12-07 NOTE — Telephone Encounter (Signed)
 Please see his messages.  Thanks.

## 2023-12-13 ENCOUNTER — Telehealth: Payer: Self-pay | Admitting: *Deleted

## 2023-12-13 NOTE — Telephone Encounter (Signed)
   Pre-operative Risk Assessment    Patient Name: [name] [name]  DOB: 1966-05-10 MRN: [id number]   Date of last office visit: 11/03/23 DR. CAMNITZ; 07/14/23 DR. TOBB Date of next office visit: NONE   Request for Surgical Clearance    Procedure:  LEFT TOTAL HIP ARTHROPLASTY   Date of Surgery:  Clearance 05/10/24                                Surgeon:  DR. KAY OZELL Cummins Surgeon's Group or Practice Name:  [facility] Phone number:  [phone] Fax number:  [phone] ATTN DEBBIE NARDI   Type of Clearance Requested:   - Medical  - Pharmacy:  Hold Rivaroxaban  (Xarelto )     Type of Anesthesia:  Spinal   Additional requests/questions:    Bonney Niels Jest   12/13/2023, 8:49 AM

## 2023-12-14 NOTE — Telephone Encounter (Signed)
 Please advise holding Xarelto  prior to left total hip arthroplasty on 12/24  Thank you!  DW

## 2023-12-21 NOTE — Telephone Encounter (Signed)
 Patient with diagnosis of Paroxysmal atrial fibrillation/flutter and hx of DVT  on Xarelto  for anticoagulation.    Procedure: LEFT TOTAL HIP ARTHROPLASTY  Date of procedure: 05/10/24     CHA2DS2-VASc Score = 1   This indicates a 0.6% annual risk of stroke. The patient's score is based upon: CHF History: 0 HTN History: 1 Diabetes History: 0 Stroke History: 0 Vascular Disease History: 0 Age Score: 0 Gender Score: 0       CrCl 83 mL/min Platelet count 260 K   Per office protocol, patient can hold Xarelto   for 3 days prior to procedure.   Patient will not need bridging with Lovenox (enoxaparin) around procedure.  **This guidance is not considered finalized until pre-operative APP has relayed final recommendations.**

## 2023-12-23 NOTE — Telephone Encounter (Signed)
   Name: [name] [name]  DOB: [birth year]  MRN: [id number]  Primary Cardiologist: Kardie Tobb, DO  Chart reviewed as part of pre-operative protocol coverage. Because of [name] [name] past medical history and time since last visit, he will require a follow-up telephone visit in order to better assess preoperative cardiovascular risk.  Pre-op covering staff: - Please schedule appointment and call patient to inform them. If patient already had an upcoming appointment within acceptable timeframe, please [name] pre-op clearance to the appointment notes so provider is aware. - Please contact requesting surgeon's office via preferred method (i.e, phone, fax) to inform them of need for appointment prior to surgery.  Per office protocol, patient can hold Xarelto   for 3 days prior to procedure. Please resume when medically safe to do so. Patient will not need bridging with Lovenox (enoxaparin) around procedure.  Orren LOISE Fabry, PA-C  12/23/2023, 4:16 PM

## 2023-12-24 NOTE — Telephone Encounter (Signed)
 Left patient a detailed message to call back to schedule a tele visit. First attempt.

## 2023-12-27 ENCOUNTER — Telehealth: Payer: Self-pay

## 2023-12-27 NOTE — Telephone Encounter (Signed)
 S/W pt and scheduled TELE Preop appt 03/13/24. Med Rec and consent done

## 2023-12-27 NOTE — Telephone Encounter (Signed)
 Med Rec and consent done     Patient Consent for Virtual Visit        [name] [name] has provided verbal consent on 12/27/2023 for a virtual visit (video or telephone).   CONSENT FOR VIRTUAL VISIT FOR:  [name] [name]  By participating in this virtual visit I agree to the following:  I hereby voluntarily request, consent and authorize [facility] and its employed or contracted physicians, physician assistants, nurse practitioners or other licensed health care professionals (the Practitioner), to provide me with telemedicine health care services (the "Services) as deemed necessary by the treating Practitioner. I acknowledge and consent to receive the Services by the Practitioner via telemedicine. I understand that the telemedicine visit will involve communicating with the Practitioner through live audiovisual communication technology and the disclosure of certain medical information by electronic transmission. I acknowledge that I have been given the opportunity to request an in-person assessment or other available alternative prior to the telemedicine visit and am voluntarily participating in the telemedicine visit.  I understand that I have the right to withhold or withdraw my consent to the use of telemedicine in the course of my care at any time, without affecting my right to future care or treatment, and that the Practitioner or I may terminate the telemedicine visit at any time. I understand that I have the right to inspect all information obtained and/or recorded in the course of the telemedicine visit and may receive copies of available information for a reasonable fee.  I understand that some of the potential risks of receiving the Services via telemedicine include:  Delay or interruption in medical evaluation due to technological equipment failure or disruption; Information transmitted may not be sufficient (e.g. poor resolution of images) to allow for appropriate medical  decision making by the Practitioner; and/or  In rare instances, security protocols could fail, causing a breach of personal health information.  Furthermore, I acknowledge that it is my responsibility to provide information about my medical history, conditions and care that is complete and accurate to the best of my ability. I acknowledge that Practitioner's advice, recommendations, and/or decision may be based on factors not within their control, such as incomplete or inaccurate data provided by me or distortions of diagnostic images or specimens that may result from electronic transmissions. I understand that the practice of medicine is not an exact science and that Practitioner makes no warranties or guarantees regarding treatment outcomes. I acknowledge that a copy of this consent can be made available to me via my patient portal [facility] MyChart), or I can request a printed copy by calling the office of [place] [facility].    I understand that my insurance will be billed for this visit.   I have read or had this consent read to me. I understand the contents of this consent, which adequately explains the benefits and risks of the Services being provided via telemedicine.  I have been provided ample opportunity to ask questions regarding this consent and the Services and have had my questions answered to my satisfaction. I give my informed consent for the services to be provided through the use of telemedicine in my medical care

## 2024-01-13 ENCOUNTER — Encounter (HOSPITAL_COMMUNITY): Payer: Self-pay

## 2024-01-18 ENCOUNTER — Ambulatory Visit (HOSPITAL_COMMUNITY)
Admission: RE | Admit: 2024-01-18 | Discharge: 2024-01-18 | Disposition: A | Discharge status: Home / Self Care | Source: Ambulatory Visit | Attending: Cardiology | Admitting: Cardiology

## 2024-01-18 DIAGNOSIS — I2584 Coronary atherosclerosis due to calcified coronary lesion: Secondary | ICD-10-CM | POA: Insufficient documentation

## 2024-01-18 DIAGNOSIS — I480 Paroxysmal atrial fibrillation: Secondary | ICD-10-CM

## 2024-01-18 MED ORDER — IOHEXOL 350 MG/ML SOLN
80.0000 mL | Freq: Once | INTRAVENOUS | Status: AC | PRN
  Administered 2024-01-18: 80 mL via INTRAVENOUS

## 2024-01-18 MED ORDER — IOHEXOL 350 MG/ML SOLN: 80.0000 mL | Freq: Once | INTRAVENOUS | Status: DC | PRN

## 2024-01-19 LAB — BASIC METABOLIC PANEL WITH GFR
BUN/Creatinine Ratio: 17 (ref 10–24)
BUN: 19 mg/dL (ref 8–27)
CO2: 24 mmol/L (ref 20–29)
Calcium: 9.1 mg/dL (ref 8.6–10.2)
Chloride: 103 mmol/L (ref 96–106)
Creatinine, Ser: 1.14 mg/dL (ref 0.76–1.27)
Glucose: 108 mg/dL — ABNORMAL HIGH (ref 70–99)
Potassium: 4.4 mmol/L (ref 3.5–5.2)
Sodium: 141 mmol/L (ref 134–144)
eGFR: 74 mL/min/1.73 (ref >59)

## 2024-01-19 LAB — CBC
Hematocrit: 41.6 % (ref 37.5–51.0)
Hemoglobin: 13.6 g/dL (ref 13.0–17.7)
MCH: 32.4 pg (ref 26.6–33.0)
MCHC: 32.7 g/dL (ref 31.5–35.7)
MCV: 99 fL — ABNORMAL HIGH (ref 79–97)
Platelets: 232 x10E3/uL (ref 150–450)
RBC: 4.2 x10E6/uL (ref 4.14–5.80)
RDW: 12.6 % (ref 11.6–15.4)
WBC: 4.8 x10E3/uL (ref 3.4–10.8)

## 2024-01-22 ENCOUNTER — Encounter: Payer: Self-pay | Admitting: Cardiology

## 2024-02-07 NOTE — Anesthesia Preprocedure Evaluation (Addendum)
 Anesthesia Evaluation  Patient identified by MRN, date of birth, ID band Patient awake    Reviewed: Allergy & Precautions, NPO status , Patient's Chart, lab work & pertinent test results  Airway Mallampati: II  TM Distance: >3 FB Neck ROM: Full    Dental no notable dental hx.    Pulmonary sleep apnea and Continuous Positive Airway Pressure Ventilation    Pulmonary exam normal        Cardiovascular hypertension, Pt. on medications + dysrhythmias Atrial Fibrillation  Rhythm:Irregular Rate:Normal  TTE (07/2023): 1. Left ventricular ejection fraction, by estimation, is 60 to 65%. Left  ventricular ejection fraction by PLAX is 61 %. The left ventricle has  normal function. The left ventricle has no regional wall motion  abnormalities. There is mild concentric left  ventricular hypertrophy. Left ventricular diastolic parameters are  consistent with Grade I diastolic dysfunction (impaired relaxation).   2. Right ventricular systolic function is normal. The right ventricular  size is normal.   3. Left atrial size was moderately dilated.   4. The mitral valve is normal in structure. Trivial mitral valve  regurgitation. No evidence of mitral stenosis.   5. The aortic valve is tricuspid. Aortic valve regurgitation is mild. No  aortic stenosis is present.   6. Aortic dilatation noted. There is mild dilatation of the aortic root  and of the ascending aorta, measuring 41 mm.   7. The inferior vena cava is dilated in size with >50% respiratory  variability, suggesting right atrial pressure of 8 mmHg.     Neuro/Psych   Anxiety     negative neurological ROS     GI/Hepatic Neg liver ROS, hiatal hernia,GERD  ,,  Endo/Other  neg diabetes    Renal/GU negative Renal ROS  negative genitourinary   Musculoskeletal  (+) Arthritis ,    Abdominal Normal abdominal exam  (+)   Peds  Hematology On Xarelto   Lab Results      Component                 Value               Date                      WBC                      4.8                 01/18/2024                HGB                      13.6                01/18/2024                HCT                      41.6                01/18/2024                MCV                      99 (H)              01/18/2024  PLT                      232                 01/18/2024             Lab Results      Component                Value               Date                      NA                       141                 01/18/2024                K                        4.4                 01/18/2024                CO2                      24                  01/18/2024                GLUCOSE                  108 (H)             01/18/2024                BUN                      19                  01/18/2024                CREATININE               1.14                01/18/2024                CALCIUM                  9.1                 01/18/2024                EGFR                     74                  01/18/2024                GFRNONAA                 >60                 06/04/2023              Anesthesia Other Findings   Reproductive/Obstetrics  Anesthesia Physical Anesthesia Plan  ASA: 3  Anesthesia Plan: General   Post-op Pain Management:    Induction: Intravenous  PONV Risk Score and Plan: 2 and Ondansetron , Dexamethasone , Midazolam  and Treatment may vary due to age or medical condition  Airway Management Planned: Mask and Oral ETT  Additional Equipment: None  Intra-op Plan:   Post-operative Plan: Extubation in OR  Informed Consent: I have reviewed the patients History and Physical, chart, labs and discussed the procedure including the risks, benefits and alternatives for the proposed anesthesia with the patient or authorized representative who has indicated his/her understanding and acceptance.      Dental advisory given  Plan Discussed with: CRNA  Anesthesia Plan Comments: (Anesthetic Hx:  10/2022: R Total Hip - Spinal, no AC  TTE: Normal biventricular systolic function, no valvular abnormalities, mildly dilated aortic root  58 year old male with PMH of HTN, HLD, OSA on CPAP, CKD1, pulmonary embolus, atrial fibrillation/flutter on Xarelto  - now scheduled for an ablation. Type and Screen active. Labs reviewed - Hgb 13.6, Plts 232, Cr 1.14. Plan for GETA, PIV x 2 +/- Clear Site. )         Anesthesia Quick Evaluation

## 2024-02-08 ENCOUNTER — Other Ambulatory Visit: Payer: Self-pay

## 2024-02-08 ENCOUNTER — Ambulatory Visit (HOSPITAL_BASED_OUTPATIENT_CLINIC_OR_DEPARTMENT_OTHER): Payer: Self-pay | Admitting: Certified Registered"

## 2024-02-08 ENCOUNTER — Ambulatory Visit (HOSPITAL_COMMUNITY): Payer: Self-pay | Admitting: Certified Registered"

## 2024-02-08 ENCOUNTER — Ambulatory Visit (HOSPITAL_COMMUNITY)
Admission: RE | Disposition: A | Discharge status: Home / Self Care | Payer: Self-pay | Source: Home / Self Care | Attending: Cardiology

## 2024-02-08 ENCOUNTER — Ambulatory Visit (HOSPITAL_COMMUNITY)
Admission: RE | Admit: 2024-02-08 | Discharge: 2024-02-08 | Disposition: A | Discharge status: Home / Self Care | Attending: Cardiology | Admitting: Cardiology

## 2024-02-08 DIAGNOSIS — Z7901 Long term (current) use of anticoagulants: Secondary | ICD-10-CM | POA: Diagnosis not present

## 2024-02-08 DIAGNOSIS — I480 Paroxysmal atrial fibrillation: Secondary | ICD-10-CM | POA: Diagnosis present

## 2024-02-08 DIAGNOSIS — I483 Typical atrial flutter: Secondary | ICD-10-CM | POA: Diagnosis not present

## 2024-02-08 DIAGNOSIS — I10 Essential (primary) hypertension: Secondary | ICD-10-CM

## 2024-02-08 DIAGNOSIS — I4891 Unspecified atrial fibrillation: Secondary | ICD-10-CM

## 2024-02-08 DIAGNOSIS — G4730 Sleep apnea, unspecified: Secondary | ICD-10-CM | POA: Insufficient documentation

## 2024-02-08 DIAGNOSIS — F419 Anxiety disorder, unspecified: Secondary | ICD-10-CM

## 2024-02-08 HISTORY — PX: ATRIAL FIBRILLATION ABLATION: EP1191

## 2024-02-08 LAB — POCT ACTIVATED CLOTTING TIME: Activated Clotting Time: 291 s

## 2024-02-08 SURGERY — ATRIAL FIBRILLATION ABLATION
Anesthesia: General

## 2024-02-08 MED ORDER — SODIUM CHLORIDE 0.9 % IV SOLN: INTRAVENOUS | Status: DC

## 2024-02-08 MED ORDER — HEPARIN SODIUM (PORCINE) 1000 UNIT/ML IJ SOLN
INTRAMUSCULAR | Status: DC | PRN
  Administered 2024-02-08: 3000 [IU] via INTRAVENOUS
  Administered 2024-02-08: 14000 [IU] via INTRAVENOUS

## 2024-02-08 MED ORDER — ACETAMINOPHEN 325 MG PO TABS
650.0000 mg | ORAL_TABLET | ORAL | Status: DC | PRN
  Administered 2024-02-08: 650 mg via ORAL
  Filled 2024-02-08: qty 2

## 2024-02-08 MED ORDER — ONDANSETRON HCL 4 MG/2ML IJ SOLN
INTRAMUSCULAR | Status: DC | PRN
  Administered 2024-02-08: 4 mg via INTRAVENOUS

## 2024-02-08 MED ORDER — LIDOCAINE 2% (20 MG/ML) 5 ML SYRINGE
INTRAMUSCULAR | Status: DC | PRN
  Administered 2024-02-08: 80 mg via INTRAVENOUS

## 2024-02-08 MED ORDER — FENTANYL CITRATE (PF) 100 MCG/2ML IJ SOLN
INTRAMUSCULAR | Status: AC
  Filled 2024-02-08: qty 2

## 2024-02-08 MED ORDER — ROCURONIUM BROMIDE 10 MG/ML (PF) SYRINGE
PREFILLED_SYRINGE | INTRAVENOUS | Status: DC | PRN
  Administered 2024-02-08 (×2): 20 mg via INTRAVENOUS
  Administered 2024-02-08: 60 mg via INTRAVENOUS

## 2024-02-08 MED ORDER — ONDANSETRON HCL 4 MG/2ML IJ SOLN: 4.0000 mg | Freq: Four times a day (QID) | INTRAMUSCULAR | Status: DC | PRN

## 2024-02-08 MED ORDER — ATROPINE SULFATE 1 MG/10ML IJ SOSY
PREFILLED_SYRINGE | INTRAMUSCULAR | Status: AC
  Filled 2024-02-08: qty 10

## 2024-02-08 MED ORDER — ATROPINE SULFATE 1 MG/10ML IJ SOSY
PREFILLED_SYRINGE | INTRAMUSCULAR | Status: DC | PRN
  Administered 2024-02-08: 1 mg via INTRAVENOUS

## 2024-02-08 MED ORDER — HEPARIN (PORCINE) IN NACL 1000-0.9 UT/500ML-% IV SOLN
INTRAVENOUS | Status: DC | PRN
  Administered 2024-02-08 (×3): 500 mL

## 2024-02-08 MED ORDER — SUGAMMADEX SODIUM 200 MG/2ML IV SOLN
INTRAVENOUS | Status: DC | PRN
  Administered 2024-02-08: 200 mg via INTRAVENOUS

## 2024-02-08 MED ORDER — PHENYLEPHRINE HCL-NACL 20-0.9 MG/250ML-% IV SOLN
INTRAVENOUS | Status: DC | PRN
  Administered 2024-02-08: 25 ug/min via INTRAVENOUS

## 2024-02-08 MED ORDER — SODIUM CHLORIDE 0.9 % IV SOLN: 250.0000 mL | INTRAVENOUS | Status: DC | PRN

## 2024-02-08 MED ORDER — PROPOFOL 10 MG/ML IV BOLUS
INTRAVENOUS | Status: DC | PRN
  Administered 2024-02-08: 170 mg via INTRAVENOUS

## 2024-02-08 MED ORDER — DEXAMETHASONE SODIUM PHOSPHATE 10 MG/ML IJ SOLN
INTRAMUSCULAR | Status: DC | PRN
  Administered 2024-02-08: 10 mg via INTRAVENOUS

## 2024-02-08 MED ORDER — FENTANYL CITRATE (PF) 250 MCG/5ML IJ SOLN
INTRAMUSCULAR | Status: DC | PRN
  Administered 2024-02-08: 100 ug via INTRAVENOUS

## 2024-02-08 MED ORDER — PROTAMINE SULFATE 10 MG/ML IV SOLN
INTRAVENOUS | Status: DC | PRN
  Administered 2024-02-08: 10 mg via INTRAVENOUS
  Administered 2024-02-08: 30 mg via INTRAVENOUS

## 2024-02-08 SURGICAL SUPPLY — 20 items
BAG SNAP BAND KOVER 36X36 (MISCELLANEOUS) IMPLANT
BLANKET WARM UNDERBOD FULL ACC (MISCELLANEOUS) ×1 IMPLANT
CABLE FARASTAR GEN2 SNGL USE (CABLE) IMPLANT
CATH EZ STEER NAV 8MM D-F CUR (ABLATOR) IMPLANT
CATH FARAWAVE 2.0 31 (CATHETERS) IMPLANT
CATH GE 8FR SOUNDSTAR (CATHETERS) IMPLANT
CATH OCTARAY 2.0 F 3-3-3-3-3 (CATHETERS) IMPLANT
CATH WEBSTER BI DIR CS D-F CRV (CATHETERS) IMPLANT
CLOSURE MYNX CONTROL 6F/7F (Vascular Products) IMPLANT
COVER SWIFTLINK CONNECTOR (BAG) ×1 IMPLANT
DILATOR VESSEL 38 20CM 16FR (INTRODUCER) IMPLANT
GUIDEWIRE INQWIRE 1.5J.035X260 (WIRE) IMPLANT
KIT VERSACROSS CNCT FARADRIVE (KITS) IMPLANT
PACK EP LF (CUSTOM PROCEDURE TRAY) ×1 IMPLANT
PAD DEFIB RADIO PHYSIO CONN (PAD) ×1 IMPLANT
PATCH CARTO3 (PAD) IMPLANT
SHEATH FARADRIVE STEERABLE (SHEATH) IMPLANT
SHEATH PINNACLE 8F 10CM (SHEATH) IMPLANT
SHEATH PINNACLE 9F 10CM (SHEATH) IMPLANT
SHEATH PROBE COVER 6X72 (BAG) IMPLANT

## 2024-02-08 NOTE — Discharge Instructions (Signed)

## 2024-02-08 NOTE — Progress Notes (Signed)
Up and walked and tolerated well; bilat groins stable, no bleeding or hematoma; Dr Camnitz in and ok to d/c home 

## 2024-02-08 NOTE — Transfer of Care (Signed)
 Immediate Anesthesia Transfer of Care Note  Patient: [name] [name]  Procedure(s) Performed: ATRIAL FIBRILLATION ABLATION  Patient Location: Cath Lab  Anesthesia Type:General  Level of Consciousness: awake, alert , and oriented  Airway & Oxygen Therapy: Patient Spontanous Breathing and Patient connected to face mask oxygen  Post-op Assessment: Report given to RN and Post -op Vital signs reviewed and stable  Post vital signs: Reviewed and stable  Last Vitals:  Vitals Value Taken Time  BP 130/81 02/08/24 08:49  Temp    Pulse 84 02/08/24 08:52  Resp 14 02/08/24 08:52  SpO2 97 % 02/08/24 08:52  Vitals shown include unfiled device data.  Last Pain:  Vitals:   02/08/24 0628  TempSrc:   PainSc: 0-No pain         Complications: There were no known notable events for this encounter.

## 2024-02-08 NOTE — Anesthesia Postprocedure Evaluation (Signed)
 Anesthesia Post Note  Patient: [name] [name]  Procedure(s) Performed: ATRIAL FIBRILLATION ABLATION     Patient location during evaluation: PACU Anesthesia Type: General Level of consciousness: awake and alert Pain management: pain level controlled Vital Signs Assessment: post-procedure vital signs reviewed and stable Respiratory status: spontaneous breathing, nonlabored ventilation, respiratory function stable and patient connected to nasal cannula oxygen Cardiovascular status: blood pressure returned to baseline and stable Postop Assessment: no apparent nausea or vomiting Anesthetic complications: no   There were no known notable events for this encounter.  Last Vitals:  Vitals:   02/08/24 0945 02/08/24 1000  BP: 106/70 108/68  Pulse: 68 69  Resp: 10 10  Temp:    SpO2: 93% 91%    Last Pain:  Vitals:   02/08/24 1007  TempSrc:   PainSc: 4                  Mellonie Guess P Migel Hannis

## 2024-02-08 NOTE — H&P (Signed)
  Electrophysiology Office Note:   Date:  02/08/2024  ID:  [name] [name], DOB January 13, 1966, MRN [id number]  Primary Cardiologist: Dub Huntsman, DO Primary Heart Failure: None Electrophysiologist: None      History of Present Illness:   [name] [name] is a 58 y.o. male with h/o atrial for 5 atrial flutter, PE, hypertension, hyperlipidemia seen today for  for Electrophysiology evaluation of atrial fibrillation at the request of Kardit Tobb.   He presented to cardiology clinic with intermittent palpitations and fluttering.  These occur with physical activity but also when lying in bed.  He has also had episodic dizziness.  He went to the emergency room at 1 point and was noted to be in atrial flutter.  Today, denies symptoms of palpitations, chest pain, dyspnea, orthopnea, PND, lower extremity edema, claudication, dizziness, presyncope, syncope, bleeding, or neurologic sequela. The patient is tolerating medications without difficulties. Plan ablation today.   EP Information / Studies Reviewed:    EKG is not ordered today. EKG from 09/21/2023 reviewed which showed sinus rhythm        Risk Assessment/Calculations:    CHA2DS2-VASc Score = 1   This indicates a 0.6% annual risk of stroke. The patient's score is based upon: CHF History: 0 HTN History: 1 Diabetes History: 0 Stroke History: 0 Vascular Disease History: 0 Age Score: 0 Gender Score: 0         Physical Exam:   VS:  BP 128/86   Pulse (!) 58   Temp 97.6 F (36.4 C) (Oral)   Resp 14   Ht 6' (1.829 m)   Wt 104.3 kg   SpO2 96%   BMI 31.19 kg/m    Wt Readings from Last 3 Encounters:  02/08/24 104.3 kg  11/17/23 109.3 kg  11/03/23 106.1 kg    GEN: Well nourished, well developed in no acute distress NECK: No JVD; No carotid bruits CARDIAC: Regular rate and rhythm, no murmurs, rubs, gallops RESPIRATORY:  Clear to auscultation without rales, wheezing or rhonchi  ABDOMEN: Soft, non-tender, non-distended EXTREMITIES:  No  edema; No deformity    ASSESSMENT AND PLAN:    1.  Paroxysmal atrial fibrillation/flutter: [name] [name] has presented today for surgery, with the diagnosis of AF/flutter.  The various methods of treatment have been discussed with the patient and family. After consideration of risks, benefits and other options for treatment, the patient has consented to  Procedure(s): Catheter ablation as a surgical intervention .  Risks include but not limited to complete heart block, stroke, esophageal damage, nerve damage, bleeding, vascular damage, tamponade, perforation, MI, and death. The patient's history has been reviewed, patient examined, no change in status, stable for surgery.  I have reviewed the patient's chart and labs.  Questions were answered to the patient's satisfaction.    [name] Heeter Inocencio, MD 02/08/2024 6:31 AM

## 2024-02-08 NOTE — Anesthesia Procedure Notes (Signed)
 Procedure Name: Intubation Date/Time: 02/08/2024 7:30 AM  Performed by: Delores Dus, CRNAPre-anesthesia Checklist: Patient identified, Emergency Drugs available, Suction available and Patient being monitored Patient Re-evaluated:Patient Re-evaluated prior to induction Oxygen Delivery Method: Circle system utilized Preoxygenation: Pre-oxygenation with 100% oxygen Induction Type: IV induction Ventilation: Mask ventilation without difficulty Laryngoscope Size: Miller and 2 Grade View: Grade II Tube type: Oral Tube size: 7.0 mm Number of attempts: 1 Airway Equipment and Method: Stylet and Oral airway Placement Confirmation: ETT inserted through vocal cords under direct vision, positive ETCO2 and breath sounds checked- equal and bilateral Secured at: 22 cm Tube secured with: Tape Dental Injury: Teeth and Oropharynx as per pre-operative assessment

## 2024-02-09 ENCOUNTER — Telehealth (HOSPITAL_COMMUNITY): Payer: Self-pay

## 2024-02-09 ENCOUNTER — Encounter (HOSPITAL_COMMUNITY): Payer: Self-pay | Admitting: Cardiology

## 2024-02-09 MED FILL — Fentanyl Citrate Preservative Free (PF) Inj 100 MCG/2ML: INTRAMUSCULAR | Qty: 2 | Status: AC

## 2024-02-09 NOTE — Telephone Encounter (Signed)
 Spoke with patient to complete post procedure follow up call.  Patient reports no complications with groin sites.   Instructions reviewed with patient:  Remove large bandage at puncture site after 24 hours. It is normal to have bruising, tenderness, mild swelling, and a pea or marble sized lump/knot at the groin site which can take up to three months to resolve.  Get help right away if you notice sudden swelling at the puncture site.  Check your puncture site every day for signs of infection: fever, redness, swelling, pus drainage, warmth, foul odor or excessive pain. If this occurs, please call [phone], to speak with the RN Navigator. Get help right away if your puncture site is bleeding and the bleeding does not stop after applying firm pressure to the area.  You may continue to have skipped beats/ atrial fibrillation during the first several months after your procedure.  It is very important not to miss any doses of your blood thinner Xarelto .    You will follow up with the Afib clinic on 03/07/24 and follow up with the Afib clinic on 05/12/24.    Patient verbalized understanding to all instructions provided.

## 2024-02-12 ENCOUNTER — Encounter: Payer: Self-pay | Admitting: Cardiology

## 2024-02-14 ENCOUNTER — Telehealth: Payer: Self-pay | Admitting: Cardiology

## 2024-02-14 NOTE — Telephone Encounter (Signed)
 Called patient about message. Patient stated that the knot is like 1 inch from groin site. He said the diameter is like a nickel and it is 1 1/2 inches long. Patient stated it gets some swelling around the knot, so he ices it and the swelling goes down. Patient stated it is warm to touch some what. He stated it feels like a knot, and it was painful the first day it showed up on 02/10/24, but now it is not painful. Patient denies any chest pain, SOB, or any other symptoms. Will send to Dr. Inocencio and his nurse for advisement.

## 2024-02-14 NOTE — Telephone Encounter (Signed)
 Patient noted he had an ablation on 9/23 and has now developed a hard mass under the skin in his abdomen.  Patient wants advice on next steps.

## 2024-02-14 NOTE — Telephone Encounter (Signed)
 Reviewed previous notes from telephone message and picture submitted via MyChart on 9/27. Groin site looks stable, no s/o infection or excess swelling noted. Reminded patient that a small lump/knot at/near the site is normal and can take up to three months to resolve. Patient verbalized understanding and appreciation for the follow up.

## 2024-03-07 ENCOUNTER — Ambulatory Visit (HOSPITAL_COMMUNITY)
Admission: RE | Admit: 2024-03-07 | Discharge: 2024-03-07 | Disposition: A | Discharge status: Home / Self Care | Source: Ambulatory Visit | Attending: Physician Assistant | Admitting: Physician Assistant

## 2024-03-07 VITALS — BP 150/88 | HR 65 | Ht 72.0 in | Wt 245.4 lb

## 2024-03-07 DIAGNOSIS — I483 Typical atrial flutter: Secondary | ICD-10-CM | POA: Diagnosis not present

## 2024-03-07 DIAGNOSIS — I480 Paroxysmal atrial fibrillation: Secondary | ICD-10-CM | POA: Diagnosis not present

## 2024-03-07 DIAGNOSIS — I4891 Unspecified atrial fibrillation: Secondary | ICD-10-CM | POA: Diagnosis not present

## 2024-03-07 DIAGNOSIS — D6869 Other thrombophilia: Secondary | ICD-10-CM

## 2024-03-07 NOTE — Progress Notes (Signed)
 Primary Care Physician: Regino Slater, MD Primary Cardiologist: Kardie Tobb, DO Electrophysiologist: Will Gladis Norton, MD  Referring Physician: Dr [name] is a 58 y.o. male with a history of HTN, HLD, DVT, OSA, atrial flutter, atrial fibrillation who presents for follow up in the [facility].  The patient wore a cardiac monitor which showed 2% afib burden. When in afib he has symptoms of palpitations and dizziness. He was seen by Dr Norton and started on flecainide  as a bridge to ablation. He is s/p afib and flutter ablation 02/08/24. Patient is on Xarelto  for stroke prevention.    Patient presents today for follow up for atrial fibrillation and flecainide  monitoring. He is in SR today and feels well. He denies any interim symptoms of afib. No chest pain or groin issues. No bleeding issues on anticoagulation.   Today, he denies symptoms of palpitations, chest pain, shortness of breath, orthopnea, PND, lower extremity edema, dizziness, presyncope, syncope, bleeding, or neurologic sequela. The patient is tolerating medications without difficulties and is otherwise without complaint today.    Atrial Fibrillation Risk Factors:  he does have symptoms or diagnosis of sleep apnea. he does not have a history of rheumatic fever.   Atrial Fibrillation Management history:  Previous antiarrhythmic drugs: flecainide  Previous cardioversions: none Previous ablations: 02/08/24 Anticoagulation history: Xarelto   ROS- All systems are reviewed and negative except as per the HPI above.  Past Medical History:  Diagnosis Date   Anxiety    Arthritis    DVT (deep venous thrombosis) (HCC)    GERD (gastroesophageal reflux disease)    History of hiatal hernia    Hyperlipidemia    Hypertension     Current Outpatient Medications  Medication Sig Dispense Refill   amoxicillin  (AMOXIL ) 500 MG tablet Take 4 tablets by mouth 1 hour prior to dental  procedure. 4 tablet 2   atorvastatin (LIPITOR) 40 MG tablet Take 40 mg by mouth daily.     B Complex Vitamins (VITAMIN B COMPLEX PO) Take 1 tablet by mouth daily.     Cholecalciferol 125 MCG (5000 UT) capsule Take 5,000 Units by mouth every Monday, Wednesday, and Friday.     diltiazem  (CARDIZEM  CD) 240 MG 24 hr capsule Take 1 capsule (240 mg total) by mouth daily. 90 capsule 3   hydrochlorothiazide  (HYDRODIURIL ) 25 MG tablet Take 25 mg by mouth every morning.     lisinopril  (ZESTRIL ) 20 MG tablet Take 20 mg by mouth daily.     Multiple Vitamin (MULTIVITAMIN WITH MINERALS) TABS tablet Take 1 tablet by mouth daily.     NON FORMULARY Pt uses a cpap nightly     sertraline  (ZOLOFT ) 50 MG tablet Take 50 mg by mouth daily.     XARELTO  20 MG TABS tablet Take 20 mg by mouth daily with supper.     No current facility-administered medications for this encounter.    Physical Exam: BP (!) 150/88   Pulse 65   Ht 6' (1.829 m)   Wt 111.3 kg   BMI 33.28 kg/m   GEN: Well nourished, well developed in no acute distress CARDIAC: Regular rate and rhythm, no murmurs, rubs, gallops RESPIRATORY:  Clear to auscultation without rales, wheezing or rhonchi  ABDOMEN: Soft, non-tender, non-distended EXTREMITIES:  No edema; No deformity   Wt Readings from Last 3 Encounters:  03/07/24 111.3 kg  02/08/24 104.3 kg  11/17/23 109.3 kg     EKG today demonstrates  SR, 1st  degree AV block Vent. rate 65 BPM PR interval 210 ms QRS duration 110 ms QT/QTcB 430/447 ms   Echo 07/20/23 demonstrated   1. Left ventricular ejection fraction, by estimation, is 60 to 65%. Left  ventricular ejection fraction by PLAX is 61 %. The left ventricle has  normal function. The left ventricle has no regional wall motion  abnormalities. There is mild concentric left ventricular hypertrophy. Left ventricular diastolic parameters are consistent with Grade I diastolic dysfunction (impaired relaxation).   2. Right ventricular systolic  function is normal. The right ventricular  size is normal.   3. Left atrial size was moderately dilated.   4. The mitral valve is normal in structure. Trivial mitral valve  regurgitation. No evidence of mitral stenosis.   5. The aortic valve is tricuspid. Aortic valve regurgitation is mild. No  aortic stenosis is present.   6. Aortic dilatation noted. There is mild dilatation of the aortic root  and of the ascending aorta, measuring 41 mm.   7. The inferior vena cava is dilated in size with >50% respiratory  variability, suggesting right atrial pressure of 8 mmHg.    CHA2DS2-VASc Score = 2  The patient's score is based upon: CHF History: 0 HTN History: 1 Diabetes History: 0 Stroke History: 0 Vascular Disease History: 1 Age Score: 0 Gender Score: 0       ASSESSMENT AND PLAN: Paroxysmal Atrial Fibrillation/atrial flutter (ICD10:  I48.0) The patient's CHA2DS2-VASc score is 2, indicating a 2.2% annual risk of stroke.   S/p afib and flutter ablation 02/08/24 Patient appears to be maintaining SR Will stop flecainide  today given his elevated calcium score.  Continue Xarelto  20 mg daily with no missed doses for 3 months post ablation. Continue diltiazem  240 mg daily  Secondary Hypercoagulable State (ICD10:  D68.69) The patient is at significant risk for stroke/thromboembolism based upon his CHA2DS2-VASc Score of 2.  Continue Rivaroxaban  (Xarelto ). No bleeding issues.   CAD CAC score 1184 No anginal symptoms Followed by Dr Sheena Will stop flecainide  as above.  HTN Mildly elevated today, better controlled at home. No changes today.   OSA  Encouraged nightly CPAP   Follow up in the AF clinic in 2 months.    [facility] [address] [place], [place] [address] [phone]

## 2024-03-13 ENCOUNTER — Ambulatory Visit: Attending: Cardiology | Admitting: Emergency Medicine

## 2024-03-13 DIAGNOSIS — Z01810 Encounter for preprocedural cardiovascular examination: Secondary | ICD-10-CM | POA: Diagnosis not present

## 2024-03-13 NOTE — Progress Notes (Signed)
 Virtual Visit via Telephone Note   Because of DEEPAK BLESS co-morbid illnesses, he is at least at moderate risk for complications without adequate follow up.  This format is felt to be most appropriate for this patient at this time.  Due to technical limitations with video connection (technology), today's appointment will be conducted as an audio only telehealth visit, and ROSE HIPPLER verbally agreed to proceed in this manner.   All issues noted in this document were discussed and addressed.  No physical exam could be performed with this format.  Evaluation Performed:  Preoperative cardiovascular risk assessment _____________   Date:  03/13/2024   Patient ID:  [place] JINNY Berber, DOB 1966-04-18, MRN [id number] Patient Location:  Home Provider location:   Office  Primary Care Provider:  Regino Slater, MD Primary Cardiologist:  Kardie Tobb, DO  Chief Complaint / Patient Profile   58 y.o. y/o male with a h/o atrial fibrillation, atrial flutter, pulmonary embolism, hypertension, coronary artery disease, hypertension, obstructive sleep apnea, hyperlipidemia who is pending left total hip arthroplasty on 05/15/2024 with [facility] at [facility] and presents today for telephonic preoperative cardiovascular risk assessment.  History of Present Illness    GERMAN MANKE is a 58 y.o. male who presents via audio/video conferencing for a telehealth visit today.  Pt was last seen in cardiology clinic on 03/07/2024 by atrial fibrillation clinic with Clint, PA.  At that time TIANDRE TEALL was doing well and maintaining normal sinus rhythm status post atrial fibrillation and flutter ablation on 02/08/2024.  His flecainide  was discontinued given elevated calcium score.    The patient is now pending procedure as outlined above. Since his last visit, he denies chest pain, shortness of breath, lower extremity edema, fatigue, palpitations, melena, hematuria, hemoptysis, diaphoresis, weakness,  presyncope, syncope, orthopnea, and PND.  Today he is doing well without acute cardiovascular concerns or complaints.  He remains very active walking 4 miles daily during the week and 5 miles daily on the weekends.  He is completing 1 mile and around 15 minutes time.  While he is active he denies any exertional chest pains or dyspnea.  He denies any concerning symptoms for recurrent atrial fibrillation.  Not currently taking his blood pressure at home with recent elevated readings.  I have encouraged him to take his blood pressure daily and alert office for his consistent blood pressures greater than 140/90.  Overall no symptoms to suggest active angina.  He is easily able to complete greater than 4 METS.  Past Medical History    Past Medical History:  Diagnosis Date   Anxiety    Arthritis    DVT (deep venous thrombosis) (HCC)    GERD (gastroesophageal reflux disease)    History of hiatal hernia    Hyperlipidemia    Hypertension    Past Surgical History:  Procedure Laterality Date   ATRIAL FIBRILLATION ABLATION N/A 02/08/2024   Procedure: ATRIAL FIBRILLATION ABLATION;  Surgeon: Inocencio Soyla Lunger, MD;  Location: [facility] INVASIVE CV LAB;  Service: Cardiovascular;  Laterality: N/A;   COLONOSCOPY  2016   INGUINAL HERNIA REPAIR Right 2021   TOTAL HIP ARTHROPLASTY Right 10/26/2022   Procedure: RIGHT TOTAL HIP ARTHROPLASTY ANTERIOR APPROACH;  Surgeon: Jerri Kay HERO, MD;  Location: [facility] OR;  Service: Orthopedics;  Laterality: Right;  3-C   UMBILICAL HERNIA REPAIR      Allergies  No Known Allergies  Home Medications    Prior to Admission medications   Medication Sig Start  Date End Date Taking? Authorizing Provider  amoxicillin  (AMOXIL ) 500 MG tablet Take 4 tablets by mouth 1 hour prior to dental procedure. 02/05/23   Jerri Kay HERO, MD  atorvastatin (LIPITOR) 40 MG tablet Take 40 mg by mouth daily.    [provider]  B Complex Vitamins (VITAMIN B COMPLEX PO) Take 1 tablet by mouth  daily.    [provider]  Cholecalciferol 125 MCG (5000 UT) capsule Take 5,000 Units by mouth every Monday, Wednesday, and Friday.    [provider]  diltiazem  (CARDIZEM  CD) 240 MG 24 hr capsule Take 1 capsule (240 mg total) by mouth daily. 07/14/23   Tobb, Kardie, DO  hydrochlorothiazide  (HYDRODIURIL ) 25 MG tablet Take 25 mg by mouth every morning. 07/29/23   [provider]  lisinopril  (ZESTRIL ) 20 MG tablet Take 20 mg by mouth daily. 08/13/23   [provider]  Multiple Vitamin (MULTIVITAMIN WITH MINERALS) TABS tablet Take 1 tablet by mouth daily.    [provider]  NON FORMULARY Pt uses a cpap nightly    [provider]  sertraline  (ZOLOFT ) 50 MG tablet Take 50 mg by mouth daily.    [provider]  XARELTO  20 MG TABS tablet Take 20 mg by mouth daily with supper.    [provider]    Physical Exam    Vital Signs:  NED KAKAR does not have vital signs available for review today.  Given telephonic nature of communication, physical exam is limited. AAOx3. NAD. Normal affect.  Speech and respirations are unlabored.  Accessory Clinical Findings    None  Assessment & Plan    1.  Preoperative Cardiovascular Risk Assessment: According to the Revised Cardiac Risk Index (RCRI), his Perioperative Risk of Major Cardiac Event is (%): 0.4. His Functional Capacity in METs is: 6.61 according to the [employer] Activity Status Index (DASI).  Therefore, based on ACC/AHA guidelines, patient would be at acceptable risk for the planned procedure without further cardiovascular testing. I will route this recommendation to the requesting party via Epic fax function.  The patient was advised that if he develops new symptoms prior to surgery to contact our office to arrange for a follow-up visit, and he verbalized understanding.  He has recent history of atrial fibrillation/flutter ablation on 02/08/2024.  He is to continue Xarelto   uninterrupted for 3 months post ablation.  Per office protocol, patient can hold Xarelto  for 3 days prior to procedure on 05/15/2024.   Patient will not need bridging with Lovenox (enoxaparin) around procedure.  A copy of this note will be routed to requesting surgeon.  Time:   Today, I have spent 8 minutes with the patient with telehealth technology discussing medical history, symptoms, and management plan.     Lum LITTIE Louis, NP  03/13/2024, 9:15 AM

## 2024-03-20 ENCOUNTER — Encounter: Payer: Self-pay | Admitting: Radiology

## 2024-03-22 ENCOUNTER — Encounter: Payer: Self-pay | Admitting: Cardiology

## 2024-05-01 ENCOUNTER — Encounter: Payer: Self-pay | Admitting: Orthopaedic Surgery

## 2024-05-02 ENCOUNTER — Other Ambulatory Visit: Payer: Self-pay | Admitting: Physician Assistant

## 2024-05-02 MED ORDER — ONDANSETRON HCL 4 MG PO TABS: 4.0000 mg | ORAL_TABLET | Freq: Three times a day (TID) | ORAL | 0 refills | Status: AC | PRN

## 2024-05-02 MED ORDER — METHOCARBAMOL 750 MG PO TABS: 750.0000 mg | ORAL_TABLET | Freq: Three times a day (TID) | ORAL | 2 refills | Status: AC | PRN

## 2024-05-02 MED ORDER — OXYCODONE-ACETAMINOPHEN 5-325 MG PO TABS: 1.0000 | ORAL_TABLET | Freq: Four times a day (QID) | ORAL | 0 refills | Status: AC | PRN

## 2024-05-02 MED ORDER — DOCUSATE SODIUM 100 MG PO CAPS: 100.0000 mg | ORAL_CAPSULE | Freq: Every day | ORAL | 2 refills | Status: AC | PRN

## 2024-05-03 ENCOUNTER — Encounter (HOSPITAL_COMMUNITY): Payer: Self-pay

## 2024-05-03 NOTE — Pre-Procedure Instructions (Signed)
 Surgical Instructions   Your procedure is scheduled on May 15, 2024. Report to [facility] Main Entrance A at 5:30 A.M., then check in with the Admitting office. Any questions or running late day of surgery: call [phone]  Questions prior to your surgery date: call [phone], Monday-Friday, 8am-4pm. If you experience any cold or flu symptoms such as cough, fever, chills, shortness of breath, etc. between now and your scheduled surgery, please notify us  at the above number.     Remember:  Do not eat after midnight the night before your surgery  You may drink clear liquids until 4:15 AM the morning of your surgery.   Clear liquids allowed are: Water, Non-Citrus Juices (without pulp), Carbonated Beverages, Clear Tea (no milk, honey, etc.), Black Coffee Only (NO MILK, CREAM OR POWDERED CREAMER of any kind), and Gatorade.  Patient Instructions  The night before surgery:  No food after midnight. ONLY clear liquids after midnight  The day of surgery (if you do NOT have diabetes):  Drink ONE (1) Pre-Surgery Clear Ensure by 4:15 AM the morning of surgery. Drink in one sitting. Do not sip.  This drink was given to you during your hospital  pre-op  appointment visit.  Nothing else to drink after completing the  Pre-Surgery Clear Ensure.         If you have questions, please contact your surgeons office.    Take these medicines the morning of surgery with A SIP OF WATER: atorvastatin (LIPITOR)  diltiazem  (CARDIZEM  CD)  sertraline  (ZOLOFT )    Follow your surgeon's instructions on when to stop XARELTO .  If no instructions were given by your surgeon then you will need to call the office to get those instructions.     One week prior to surgery, STOP taking any Aspirin  (unless otherwise instructed by your surgeon) Aleve, Naproxen, Ibuprofen, Motrin, Advil, Goody's, BC's, all herbal medications, fish oil, and non-prescription vitamins.                     Do NOT Smoke  (Tobacco/Vaping) for 24 hours prior to your procedure.  If you use a CPAP at night, you may bring your mask/headgear for your overnight stay.   You will be asked to remove any contacts, glasses, piercing's, hearing aid's, dentures/partials prior to surgery. Please bring cases for these items if needed.    Patients discharged the day of surgery will not be allowed to drive home, and someone needs to stay with them for 24 hours.  SURGICAL WAITING ROOM VISITATION Patients may have no more than 2 support people in the waiting area - these visitors may rotate.   Pre-op  nurse will coordinate an appropriate time for 1 ADULT support person, who may not rotate, to accompany patient in pre-op .  Children under the age of 41 must have an adult with them who is not the patient and must remain in the main waiting area with an adult.  If the patient needs to stay at the hospital during part of their recovery, the visitor guidelines for inpatient rooms apply.  Please refer to the [facility] website for the visitor guidelines for any additional information.   If you received a COVID test during your pre-op  visit  it is requested that you wear a mask when out in public, stay away from anyone that may not be feeling well and notify your surgeon if you develop symptoms. If you have been in contact with anyone that has tested positive in the last 10  days please notify you surgeon.      Pre-operative 4 CHG Bathing Instructions   You can play a key role in reducing the risk of infection after surgery. Your skin needs to be as free of germs as possible. You can reduce the number of germs on your skin by washing with CHG (chlorhexidine  gluconate) soap before surgery. CHG is an antiseptic soap that kills germs and continues to kill germs even after washing.   DO NOT use if you have an allergy to chlorhexidine /CHG or antibacterial soaps. If your skin becomes reddened or irritated, stop using the CHG and notify one  of our RNs at [phone].   Please shower with the CHG soap starting 4 days before surgery using the following schedule:     Please keep in mind the following:  DO NOT shave, including legs and underarms, starting the day of your first shower.   You may shave your face at any point before/day of surgery.  Place clean sheets on your bed the day you start using CHG soap. Use a clean washcloth (not used since being washed) for each shower. DO NOT sleep with pets once you start using the CHG.   CHG Shower Instructions:  Wash your face and private area with normal soap. If you choose to wash your hair, wash first with your normal shampoo.  After you use shampoo/soap, rinse your hair and body thoroughly to remove shampoo/soap residue.  Turn the water OFF and apply  bottle of CHG soap to a CLEAN washcloth.  Apply CHG soap ONLY FROM YOUR NECK DOWN TO YOUR TOES (washing for 3-5 minutes)  DO NOT use CHG soap on face, private areas, open wounds, or sores.  Pay special attention to the area where your surgery is being performed.  If you are having back surgery, having someone wash your back for you may be helpful. Wait 2 minutes after CHG soap is applied, then you may rinse off the CHG soap.  Pat dry with a clean towel  Put on clean clothes/pajamas   If you choose to wear lotion, please use ONLY the CHG-compatible lotions that are listed below.  Additional instructions for the day of surgery:  If you choose, you may shower the morning of surgery with an antibacterial soap.  DO NOT APPLY any lotions, deodorants, cologne, or perfumes.   Do not bring valuables to the hospital. [facility] is not responsible for any belongings/valuables. Do not wear nail polish, gel polish, artificial nails, or any other type of covering on natural nails (fingers and toes) Do not wear jewelry or makeup Put on clean/comfortable clothes.  Please brush your teeth.  Ask your nurse before applying any prescription  medications to the skin.     CHG Compatible Lotions   Aveeno Moisturizing lotion  Cetaphil Moisturizing Cream  Cetaphil Moisturizing Lotion  Clairol Herbal Essence Moisturizing Lotion, Dry Skin  Clairol Herbal Essence Moisturizing Lotion, Extra Dry Skin  Clairol Herbal Essence Moisturizing Lotion, Normal Skin  Curel Age Defying Therapeutic Moisturizing Lotion with Alpha Hydroxy  Curel Extreme Care Body Lotion  Curel Soothing Hands Moisturizing Hand Lotion  Curel Therapeutic Moisturizing Cream, Fragrance-Free  Curel Therapeutic Moisturizing Lotion, Fragrance-Free  Curel Therapeutic Moisturizing Lotion, Original Formula  Eucerin Daily Replenishing Lotion  Eucerin Dry Skin Therapy Plus Alpha Hydroxy Crme  Eucerin Dry Skin Therapy Plus Alpha Hydroxy Lotion  Eucerin Original Crme  Eucerin Original Lotion  Eucerin Plus Crme Eucerin Plus Lotion  Eucerin TriLipid Replenishing Lotion  Keri  Anti-Bacterial Hand Lotion  Keri Deep Conditioning Original Lotion Dry Skin Formula Softly Scented  Keri Deep Conditioning Original Lotion, Fragrance Free Sensitive Skin Formula  Keri Lotion Fast Absorbing Fragrance Free Sensitive Skin Formula  Keri Lotion Fast Absorbing Softly Scented Dry Skin Formula  Keri Original Lotion  Keri Skin Renewal Lotion Keri Silky Smooth Lotion  Keri Silky Smooth Sensitive Skin Lotion  Nivea Body Creamy Conditioning Oil  Nivea Body Extra Enriched Lotion  Nivea Body Original Lotion  Nivea Body Sheer Moisturizing Lotion Nivea Crme  Nivea Skin Firming Lotion  NutraDerm 30 Skin Lotion  NutraDerm Skin Lotion  NutraDerm Therapeutic Skin Cream  NutraDerm Therapeutic Skin Lotion  ProShield Protective Hand Cream  Provon moisturizing lotion  Please read over the following fact sheets that you were given.

## 2024-05-04 ENCOUNTER — Inpatient Hospital Stay (HOSPITAL_COMMUNITY)
Admission: RE | Admit: 2024-05-04 | Discharge: 2024-05-04 | Attending: Orthopaedic Surgery | Admitting: Orthopaedic Surgery

## 2024-05-04 ENCOUNTER — Other Ambulatory Visit: Payer: Self-pay

## 2024-05-04 ENCOUNTER — Encounter (HOSPITAL_COMMUNITY): Payer: Self-pay

## 2024-05-04 VITALS — BP 156/96 | HR 70 | Temp 97.9°F | Resp 16 | Ht 72.0 in | Wt 244.0 lb

## 2024-05-04 DIAGNOSIS — M1612 Unilateral primary osteoarthritis, left hip: Secondary | ICD-10-CM | POA: Insufficient documentation

## 2024-05-04 DIAGNOSIS — I77810 Thoracic aortic ectasia: Secondary | ICD-10-CM | POA: Insufficient documentation

## 2024-05-04 DIAGNOSIS — I440 Atrioventricular block, first degree: Secondary | ICD-10-CM | POA: Insufficient documentation

## 2024-05-04 DIAGNOSIS — I351 Nonrheumatic aortic (valve) insufficiency: Secondary | ICD-10-CM | POA: Insufficient documentation

## 2024-05-04 DIAGNOSIS — Z01812 Encounter for preprocedural laboratory examination: Secondary | ICD-10-CM | POA: Insufficient documentation

## 2024-05-04 DIAGNOSIS — Z86718 Personal history of other venous thrombosis and embolism: Secondary | ICD-10-CM | POA: Diagnosis not present

## 2024-05-04 DIAGNOSIS — Z01818 Encounter for other preprocedural examination: Secondary | ICD-10-CM

## 2024-05-04 DIAGNOSIS — G4733 Obstructive sleep apnea (adult) (pediatric): Secondary | ICD-10-CM | POA: Insufficient documentation

## 2024-05-04 DIAGNOSIS — I10 Essential (primary) hypertension: Secondary | ICD-10-CM | POA: Diagnosis not present

## 2024-05-04 HISTORY — DX: Atherosclerotic heart disease of native coronary artery without angina pectoris: I25.10

## 2024-05-04 HISTORY — DX: Sleep apnea, unspecified: G47.30

## 2024-05-04 HISTORY — DX: Cardiac arrhythmia, unspecified: I49.9

## 2024-05-04 LAB — BASIC METABOLIC PANEL WITH GFR
Anion gap: 7 (ref 5–15)
BUN: 20 mg/dL (ref 6–20)
CO2: 29 mmol/L (ref 22–32)
Calcium: 9.4 mg/dL (ref 8.9–10.3)
Chloride: 108 mmol/L (ref 98–111)
Creatinine, Ser: 1.11 mg/dL (ref 0.61–1.24)
GFR, Estimated: >60 mL/min (ref >60)
Glucose, Bld: 100 mg/dL — ABNORMAL HIGH (ref 70–99)
Potassium: 3.9 mmol/L (ref 3.5–5.1)
Sodium: 143 mmol/L (ref 135–145)

## 2024-05-04 LAB — SURGICAL PCR SCREEN
MRSA, PCR: NEGATIVE
Staphylococcus aureus: POSITIVE — AB

## 2024-05-04 LAB — CBC
HCT: 42.2 % (ref 39.0–52.0)
Hemoglobin: 14.2 g/dL (ref 13.0–17.0)
MCH: 32.1 pg (ref 26.0–34.0)
MCHC: 33.6 g/dL (ref 30.0–36.0)
MCV: 95.5 fL (ref 80.0–100.0)
Platelets: 249 K/uL (ref 150–400)
RBC: 4.42 MIL/uL (ref 4.22–5.81)
RDW: 12.4 % (ref 11.5–15.5)
WBC: 5.7 K/uL (ref 4.0–10.5)
nRBC: 0 % (ref 0.0–0.2)

## 2024-05-04 LAB — TYPE AND SCREEN
ABO/RH(D): A POS
Antibody Screen: NEGATIVE

## 2024-05-04 NOTE — Progress Notes (Signed)
 PCP - Dibas Koirala,MD Cardiologist - Kardie Tobb,DO  EP Cardiologist - Will Gladis Inocencio COME   PPM/ICD - Denies Device Orders -  Rep Notified -   EKG - 03/07/24 Chest x-ray - na Stress Test - denies ECHO - 07/20/23 Cardiac Cath - denies  Sleep Study - denies CPAP -   Fasting Blood Sugar - na Checks Blood Sugar _____ times a day  Last dose of GLP1 agonist-  na GLP1 instructions:   Blood Thinner Instructions:per cardiology hold Xarelto  3 days prior to surgery. Take your last dose on 05/11/24. Aspirin  Instructions:na  ERAS Protcol -clears until 0415 PRE-SURGERY Ensure or G2- Ensure  COVID TEST- na   Anesthesia review: yes- cardiac clearance - afib ablation 02/08/24  Patient denies shortness of breath, fever, cough and chest pain at PAT appointment   All instructions explained to the patient, with a verbal understanding of the material. Patient agrees to go over the instructions while at home for a better understanding.  The opportunity to ask questions was provided.

## 2024-05-04 NOTE — Pre-Procedure Instructions (Signed)
 Surgical Instructions   Your procedure is scheduled on May 15, 2024. Report to [facility] Main Entrance A at 5:30 A.M., then check in with the Admitting office. Any questions or running late day of surgery: call [phone]  Questions prior to your surgery date: call [phone], Monday-Friday, 8am-4pm. If you experience any cold or flu symptoms such as cough, fever, chills, shortness of breath, etc. between now and your scheduled surgery, please notify us  at the above number.     Remember:  Do not eat after midnight the night before your surgery  You may drink clear liquids until 4:15 AM the morning of your surgery.   Clear liquids allowed are: Water, Non-Citrus Juices (without pulp), Carbonated Beverages, Clear Tea (no milk, honey, etc.), Black Coffee Only (NO MILK, CREAM OR POWDERED CREAMER of any kind), and Gatorade.  Patient Instructions  The night before surgery:  No food after midnight. ONLY clear liquids after midnight  The day of surgery (if you do NOT have diabetes):  Drink ONE (1) Pre-Surgery Clear Ensure by 4:15 AM the morning of surgery. Drink in one sitting. Do not sip.  This drink was given to you during your hospital  pre-op  appointment visit.  Nothing else to drink after completing the  Pre-Surgery Clear Ensure.         If you have questions, please contact your surgeons office.    Take these medicines the morning of surgery with A SIP OF WATER: atorvastatin (LIPITOR)  diltiazem  (CARDIZEM  CD)  sertraline  (ZOLOFT )    Per your cardiologist's instructions you may hold your Xarelto  3 days prior to your surgery. Take your last dpse on 05/11/24.   One week prior to surgery, STOP taking any Aspirin  (unless otherwise instructed by your surgeon) Aleve, Naproxen, Ibuprofen, Motrin, Advil, Goody's, BC's, all herbal medications, fish oil, and non-prescription vitamins.                     Do NOT Smoke (Tobacco/Vaping) for 24 hours prior to your  procedure.  If you use a CPAP at night, you may bring your mask/headgear for your overnight stay.   You will be asked to remove any contacts, glasses, piercing's, hearing aid's, dentures/partials prior to surgery. Please bring cases for these items if needed.    Patients discharged the day of surgery will not be allowed to drive home, and someone needs to stay with them for 24 hours.  SURGICAL WAITING ROOM VISITATION Patients may have no more than 2 support people in the waiting area - these visitors may rotate.   Pre-op  nurse will coordinate an appropriate time for 1 ADULT support person, who may not rotate, to accompany patient in pre-op .  Children under the age of 67 must have an adult with them who is not the patient and must remain in the main waiting area with an adult.  If the patient needs to stay at the hospital during part of their recovery, the visitor guidelines for inpatient rooms apply.  Please refer to the [facility] website for the visitor guidelines for any additional information.   If you received a COVID test during your pre-op  visit  it is requested that you wear a mask when out in public, stay away from anyone that may not be feeling well and notify your surgeon if you develop symptoms. If you have been in contact with anyone that has tested positive in the last 10 days please notify you surgeon.      Pre-operative  4 CHG Bathing Instructions   You can play a key role in reducing the risk of infection after surgery. Your skin needs to be as free of germs as possible. You can reduce the number of germs on your skin by washing with CHG (chlorhexidine  gluconate) soap before surgery. CHG is an antiseptic soap that kills germs and continues to kill germs even after washing.   DO NOT use if you have an allergy to chlorhexidine /CHG or antibacterial soaps. If your skin becomes reddened or irritated, stop using the CHG and notify one of our RNs at [phone].   Please  shower with the CHG soap starting 4 days before surgery using the following schedule:     Please keep in mind the following:  DO NOT shave, including legs and underarms, starting the day of your first shower.   You may shave your face at any point before/day of surgery.  Place clean sheets on your bed the day you start using CHG soap. Use a clean washcloth (not used since being washed) for each shower. DO NOT sleep with pets once you start using the CHG.   CHG Shower Instructions:  Wash your face and private area with normal soap. If you choose to wash your hair, wash first with your normal shampoo.  After you use shampoo/soap, rinse your hair and body thoroughly to remove shampoo/soap residue.  Turn the water OFF and apply  bottle of CHG soap to a CLEAN washcloth.  Apply CHG soap ONLY FROM YOUR NECK DOWN TO YOUR TOES (washing for 3-5 minutes)  DO NOT use CHG soap on face, private areas, open wounds, or sores.  Pay special attention to the area where your surgery is being performed.  If you are having back surgery, having someone wash your back for you may be helpful. Wait 2 minutes after CHG soap is applied, then you may rinse off the CHG soap.  Pat dry with a clean towel  Put on clean clothes/pajamas   If you choose to wear lotion, please use ONLY the CHG-compatible lotions that are listed below.  Additional instructions for the day of surgery:  If you choose, you may shower the morning of surgery with an antibacterial soap.  DO NOT APPLY any lotions, deodorants, cologne, or perfumes.   Do not bring valuables to the hospital. [facility] is not responsible for any belongings/valuables. Do not wear nail polish, gel polish, artificial nails, or any other type of covering on natural nails (fingers and toes) Do not wear jewelry or makeup Put on clean/comfortable clothes.  Please brush your teeth.  Ask your nurse before applying any prescription medications to the skin.     CHG  Compatible Lotions   Aveeno Moisturizing lotion  Cetaphil Moisturizing Cream  Cetaphil Moisturizing Lotion  Clairol Herbal Essence Moisturizing Lotion, Dry Skin  Clairol Herbal Essence Moisturizing Lotion, Extra Dry Skin  Clairol Herbal Essence Moisturizing Lotion, Normal Skin  Curel Age Defying Therapeutic Moisturizing Lotion with Alpha Hydroxy  Curel Extreme Care Body Lotion  Curel Soothing Hands Moisturizing Hand Lotion  Curel Therapeutic Moisturizing Cream, Fragrance-Free  Curel Therapeutic Moisturizing Lotion, Fragrance-Free  Curel Therapeutic Moisturizing Lotion, Original Formula  Eucerin Daily Replenishing Lotion  Eucerin Dry Skin Therapy Plus Alpha Hydroxy Crme  Eucerin Dry Skin Therapy Plus Alpha Hydroxy Lotion  Eucerin Original Crme  Eucerin Original Lotion  Eucerin Plus Crme Eucerin Plus Lotion  Eucerin TriLipid Replenishing Lotion  Keri Anti-Bacterial Hand Lotion  Keri Deep Conditioning Original Lotion Dry Skin  Formula Softly Scented  Keri Deep Conditioning Original Lotion, Fragrance Free Sensitive Skin Formula  Keri Lotion Fast Absorbing Fragrance Free Sensitive Skin Formula  Keri Lotion Fast Absorbing Softly Scented Dry Skin Formula  Keri Original Lotion  Keri Skin Renewal Lotion Keri Silky Smooth Lotion  Keri Silky Smooth Sensitive Skin Lotion  Nivea Body Creamy Conditioning Oil  Nivea Body Extra Enriched Lotion  Nivea Body Original Lotion  Nivea Body Sheer Moisturizing Lotion Nivea Crme  Nivea Skin Firming Lotion  NutraDerm 30 Skin Lotion  NutraDerm Skin Lotion  NutraDerm Therapeutic Skin Cream  NutraDerm Therapeutic Skin Lotion  ProShield Protective Hand Cream  Provon moisturizing lotion  Please read over the following fact sheets that you were given.

## 2024-05-05 NOTE — Anesthesia Preprocedure Evaluation (Addendum)
 "                                  Anesthesia Evaluation  Patient identified by MRN, date of birth, ID band Patient awake    Reviewed: Allergy & Precautions, NPO status , Patient's Chart, lab work & pertinent test results  History of Anesthesia Complications Negative for: history of anesthetic complications  Airway Mallampati: II  TM Distance: >3 FB Neck ROM: Full    Dental no notable dental hx.    Pulmonary sleep apnea and Continuous Positive Airway Pressure Ventilation    Pulmonary exam normal        Cardiovascular hypertension, Pt. on medications + CAD  (-) Past MI + dysrhythmias Atrial Fibrillation  Rhythm:Irregular Rate:Normal  TTE (07/2023): 1. Left ventricular ejection fraction, by estimation, is 60 to 65%. Left  ventricular ejection fraction by PLAX is 61 %. The left ventricle has  normal function. The left ventricle has no regional wall motion  abnormalities. There is mild concentric left  ventricular hypertrophy. Left ventricular diastolic parameters are  consistent with Grade I diastolic dysfunction (impaired relaxation).   2. Right ventricular systolic function is normal. The right ventricular  size is normal.   3. Left atrial size was moderately dilated.   4. The mitral valve is normal in structure. Trivial mitral valve  regurgitation. No evidence of mitral stenosis.   5. The aortic valve is tricuspid. Aortic valve regurgitation is mild. No  aortic stenosis is present.   6. Aortic dilatation noted. There is mild dilatation of the aortic root  and of the ascending aorta, measuring 41 mm.   7. The inferior vena cava is dilated in size with >50% respiratory  variability, suggesting right atrial pressure of 8 mmHg.     Neuro/Psych neg Seizures  Anxiety     negative neurological ROS     GI/Hepatic Neg liver ROS, hiatal hernia,GERD  ,,  Endo/Other  neg diabetes    Renal/GU negative Renal ROS  negative genitourinary   Musculoskeletal  (+)  Arthritis ,  left hip osteoarthritis   Abdominal Normal abdominal exam  (+)   Peds  Hematology On Xarelto    Anesthesia Other Findings   Reproductive/Obstetrics                              Anesthesia Physical Anesthesia Plan  ASA: 3  Anesthesia Plan: Spinal   Post-op Pain Management: Tylenol  PO (pre-op )*   Induction:   PONV Risk Score and Plan: 2 and Ondansetron , Dexamethasone , Treatment may vary due to age or medical condition and Propofol  infusion  Airway Management Planned: Natural Airway and Simple Face Mask  Additional Equipment: None  Intra-op Plan:   Post-operative Plan: Extubation in OR  Informed Consent: I have reviewed the patients History and Physical, chart, labs and discussed the procedure including the risks, benefits and alternatives for the proposed anesthesia with the patient or authorized representative who has indicated his/her understanding and acceptance.     Dental advisory given  Plan Discussed with: CRNA  Anesthesia Plan Comments: (PAT note by Lynwood Hope, PA-C: 58 year old male follows with cardiology for history of A-fib/flutter s/p ablation 02/05/2024, HLD, HTN, DVT, OSA on CPAP.  Echo 07/2023 showed LVEF 60 to 65%, grade 1 DD, normal RV, LA moderately dilated, mild aortic regurgitation, mild dilatation of the aortic root 41 mm.  He was seen  in follow-up in EP cardiology clinic by Daril Kicks, PA-C on 03/07/2024 and noted to be maintaining sinus rhythm.  Flecainide  was stopped at that time.  He was recommended to continue Xarelto  20 mg daily with no missed doses for 3 months post ablation.  He was also continued on diltiazem  240 mg daily.  He was seen in general cardiology follow-up by [facility], 03/13/2024 for preop evaluation.  Per note, According to the Revised Cardiac Risk Index (RCRI), his Perioperative Risk of Major Cardiac Event is (%): 0.4. His Functional Capacity in METs is: 6.61 according to the [facility] Activity  Status Index (DASI). Therefore, based on ACC/AHA guidelines, patient would be at acceptable risk for the planned procedure without further cardiovascular testing. I will route this recommendation to the requesting party via Epic fax function. The patient was advised that if he develops new symptoms prior to surgery to contact our office to arrange for a follow-up visit, and he verbalized understanding. He has recent history of atrial fibrillation/flutter ablation on 02/08/2024.  He is to continue Xarelto  uninterrupted for 3 months post ablation. Per office protocol, patient can hold Xarelto  for 3 days prior to procedure on 05/15/2024.   Patient will not need bridging with Lovenox (enoxaparin) around procedure.  Patient reports last dose Xarelto  05/11/2024.  Other pertinent history includes GERD, hiatal hernia, anxiety.  Preop labs reviewed, WNL.  EKG 03/07/2024: Sinus rhythm with first-degree AV block.  Rate 65.  TTE 07/20/2023: 1. Left ventricular ejection fraction, by estimation, is 60 to 65%. Left  ventricular ejection fraction by PLAX is 61 %. The left ventricle has  normal function. The left ventricle has no regional wall motion  abnormalities. There is mild concentric left  ventricular hypertrophy. Left ventricular diastolic parameters are  consistent with Grade I diastolic dysfunction (impaired relaxation).  2. Right ventricular systolic function is normal. The right ventricular  size is normal.  3. Left atrial size was moderately dilated.  4. The mitral valve is normal in structure. Trivial mitral valve  regurgitation. No evidence of mitral stenosis.  5. The aortic valve is tricuspid. Aortic valve regurgitation is mild. No  aortic stenosis is present.  6. Aortic dilatation noted. There is mild dilatation of the aortic root  and of the ascending aorta, measuring 41 mm.  7. The inferior vena cava is dilated in size with >50% respiratory  variability, suggesting right atrial  pressure of 8 mmHg.    )         Anesthesia Quick Evaluation  "

## 2024-05-05 NOTE — Progress Notes (Signed)
 Anesthesia Chart Review:  58 year old male follows with cardiology for history of A-fib/flutter s/p ablation 02/05/2024, HLD, HTN, DVT, OSA on CPAP.  Echo 07/2023 showed LVEF 60 to 65%, grade 1 DD, normal RV, LA moderately dilated, mild aortic regurgitation, mild dilatation of the aortic root 41 mm.  He was seen in follow-up in EP cardiology clinic by Daril Kicks, PA-C on 03/07/2024 and noted to be maintaining sinus rhythm.  Flecainide  was stopped at that time.  He was recommended to continue Xarelto  20 mg daily with no missed doses for 3 months post ablation.  He was also continued on diltiazem  240 mg daily.  He was seen in general cardiology follow-up by [facility], 03/13/2024 for preop evaluation.  Per note, According to the Revised Cardiac Risk Index (RCRI), his Perioperative Risk of Major Cardiac Event is (%): 0.4. His Functional Capacity in METs is: 6.61 according to the Duke Activity Status Index (DASI). Therefore, based on ACC/AHA guidelines, patient would be at acceptable risk for the planned procedure without further cardiovascular testing. I will route this recommendation to the requesting party via Epic fax function. The patient was advised that if he develops new symptoms prior to surgery to contact our office to arrange for a follow-up visit, and he verbalized understanding. He has recent history of atrial fibrillation/flutter ablation on 02/08/2024.  He is to continue Xarelto  uninterrupted for 3 months post ablation. Per office protocol, patient can hold Xarelto  for 3 days prior to procedure on 05/15/2024.   Patient will not need bridging with Lovenox (enoxaparin) around procedure.  Patient reports last dose Xarelto  05/11/2024.  Other pertinent history includes GERD, hiatal hernia, anxiety.  Preop labs reviewed, WNL.  EKG 03/07/2024: Sinus rhythm with first-degree AV block.  Rate 65.  TTE 07/20/2023: 1. Left ventricular ejection fraction, by estimation, is 60 to 65%. Left   ventricular ejection fraction by PLAX is 61 %. The left ventricle has  normal function. The left ventricle has no regional wall motion  abnormalities. There is mild concentric left  ventricular hypertrophy. Left ventricular diastolic parameters are  consistent with Grade I diastolic dysfunction (impaired relaxation).   2. Right ventricular systolic function is normal. The right ventricular  size is normal.   3. Left atrial size was moderately dilated.   4. The mitral valve is normal in structure. Trivial mitral valve  regurgitation. No evidence of mitral stenosis.   5. The aortic valve is tricuspid. Aortic valve regurgitation is mild. No  aortic stenosis is present.   6. Aortic dilatation noted. There is mild dilatation of the aortic root  and of the ascending aorta, measuring 41 mm.   7. The inferior vena cava is dilated in size with >50% respiratory  variability, suggesting right atrial pressure of 8 mmHg.      Lynwood Geofm RIGGERS [facility] Short Stay Center/Anesthesiology Phone [phone] 05/05/2024 4:07 PM

## 2024-05-12 ENCOUNTER — Ambulatory Visit (HOSPITAL_COMMUNITY)
Admission: RE | Admit: 2024-05-12 | Discharge: 2024-05-12 | Disposition: A | Discharge status: Home / Self Care | Source: Ambulatory Visit | Attending: Physician Assistant | Admitting: Physician Assistant

## 2024-05-12 VITALS — BP 152/96 | HR 68 | Ht 72.0 in | Wt 251.2 lb

## 2024-05-12 DIAGNOSIS — I10 Essential (primary) hypertension: Secondary | ICD-10-CM

## 2024-05-12 DIAGNOSIS — I480 Paroxysmal atrial fibrillation: Secondary | ICD-10-CM

## 2024-05-12 DIAGNOSIS — D6869 Other thrombophilia: Secondary | ICD-10-CM

## 2024-05-12 DIAGNOSIS — I4891 Unspecified atrial fibrillation: Secondary | ICD-10-CM | POA: Diagnosis not present

## 2024-05-12 DIAGNOSIS — I483 Typical atrial flutter: Secondary | ICD-10-CM | POA: Diagnosis not present

## 2024-05-12 MED ORDER — TRANEXAMIC ACID 1000 MG/10ML IV SOLN
2000.0000 mg | INTRAVENOUS | Status: DC
  Filled 2024-05-12: qty 20

## 2024-05-12 NOTE — Progress Notes (Signed)
"   Primary Care Physician: [name] Slater, MD Primary Cardiologist: [name] Tobb, DO Electrophysiologist: [name] Gladis Norton, MD  Referring Physician: Dr [name] [name] [name] [name] is a 58 y.o. male with a history of HTN, HLD, DVT, OSA, atrial flutter, atrial fibrillation who presents for follow up in the [facility].  The patient wore a cardiac monitor which showed 2% afib burden. When in afib he has symptoms of palpitations and dizziness. He was seen by Dr [name] and started on flecainide  as a bridge to ablation. He is s/p afib and flutter ablation 02/08/24. Patient is on Xarelto  for stroke prevention.    Patient returns for follow up for atrial fibrillation. He remains in SR today and feels well. He denies any interim symptoms of afib. No bleeding issues on anticoagulation. He is scheduled for hip surgery on 05/15/24 and is holding Xarelto  starting today.   Today, he  denies symptoms of palpitations, chest pain, shortness of breath, orthopnea, PND, lower extremity edema, dizziness, presyncope, syncope, bleeding, or neurologic sequela. The patient is tolerating medications without difficulties and is otherwise without complaint today.    Atrial Fibrillation Risk Factors:  he does have symptoms or diagnosis of sleep apnea. he does not have a history of rheumatic fever.   Atrial Fibrillation Management history:  Previous antiarrhythmic drugs: flecainide  Previous cardioversions: none Previous ablations: 02/08/24 Anticoagulation history: Xarelto   ROS- All systems are reviewed and negative except as per the HPI above.  Past Medical History:  Diagnosis Date   Anxiety    Arthritis    Coronary artery disease    Per CT Coronary completed 01/2024   DVT (deep venous thrombosis) (HCC)    Dysrhythmia    A.Fib   GERD (gastroesophageal reflux disease)    History of hiatal hernia    Hyperlipidemia    Hypertension    Sleep apnea     Current Outpatient  Medications  Medication Sig Dispense Refill   amoxicillin  (AMOXIL ) 500 MG tablet Take 4 tablets by mouth 1 hour prior to dental procedure. 4 tablet 2   atorvastatin (LIPITOR) 40 MG tablet Take 40 mg by mouth in the morning.     B Complex Vitamins (VITAMIN B COMPLEX PO) Take 1 tablet by mouth in the morning.     Cholecalciferol (FT VITAMIN D3) 250 MCG (10000 UT) TABS Take 10,000 Units by mouth every Monday, Wednesday, and Friday. In the morning.     diltiazem  (CARDIZEM  CD) 240 MG 24 hr capsule Take 1 capsule (240 mg total) by mouth daily. 90 capsule 3   docusate sodium  (COLACE) 100 MG capsule Take 1 capsule (100 mg total) by mouth daily as needed. 30 capsule 2   hydrochlorothiazide  (HYDRODIURIL ) 25 MG tablet Take 25 mg by mouth every morning.     lisinopril  (ZESTRIL ) 20 MG tablet Take 20 mg by mouth in the morning.     methocarbamol  (ROBAXIN ) 750 MG tablet Take 1 tablet (750 mg total) by mouth 3 (three) times daily as needed. 30 tablet 2   Multiple Vitamin (MULTIVITAMIN WITH MINERALS) TABS tablet Take 1 tablet by mouth in the morning.     NON FORMULARY Pt uses a cpap nightly     ondansetron  (ZOFRAN ) 4 MG tablet Take 1 tablet (4 mg total) by mouth every 8 (eight) hours as needed for nausea or vomiting. 40 tablet 0   oxyCODONE -acetaminophen  (PERCOCET) 5-325 MG tablet Take 1-2 tablets by mouth every 6 (six) hours as needed. To be taken  after surgery 40 tablet 0   sertraline  (ZOLOFT ) 50 MG tablet Take 50 mg by mouth in the morning.     sildenafil (VIAGRA) 100 MG tablet SMARTSIG:0.5-1 Tablet(s) By Mouth (Patient taking differently: Take 0.5-1 tablets by mouth as needed.)     XARELTO  20 MG TABS tablet Take 20 mg by mouth in the morning.     No current facility-administered medications for this encounter.    Physical Exam: BP (!) 152/96   Pulse 68   Ht 6' (1.829 m)   Wt 113.9 kg   BMI 34.07 kg/m   GEN: Well nourished, well developed in no acute distress CARDIAC: Regular rate and rhythm, no  murmurs, rubs, gallops RESPIRATORY:  Clear to auscultation without rales, wheezing or rhonchi  ABDOMEN: Soft, non-tender, non-distended EXTREMITIES:  No edema; No deformity    Wt Readings from Last 3 Encounters:  05/12/24 113.9 kg  05/04/24 110.7 kg  03/07/24 111.3 kg     EKG Interpretation Date/Time:  Friday May 12 2024 09:07:31 EST Ventricular Rate:  68 PR Interval:  182 QRS Duration:  108 QT Interval:  402 QTC Calculation: 427 R Axis:   23  Text Interpretation: Normal sinus rhythm Possible Inferior infarct , age undetermined Abnormal ECG When compared with ECG of 07-Mar-2024 09:07, No significant change was found Confirmed by [name] [name] ([id number]) on 05/12/2024 9:17:16 AM    Echo 07/20/23 demonstrated   1. Left ventricular ejection fraction, by estimation, is 60 to 65%. Left  ventricular ejection fraction by PLAX is 61 %. The left ventricle has  normal function. The left ventricle has no regional wall motion  abnormalities. There is mild concentric left ventricular hypertrophy. Left ventricular diastolic parameters are consistent with Grade I diastolic dysfunction (impaired relaxation).   2. Right ventricular systolic function is normal. The right ventricular  size is normal.   3. Left atrial size was moderately dilated.   4. The mitral valve is normal in structure. Trivial mitral valve  regurgitation. No evidence of mitral stenosis.   5. The aortic valve is tricuspid. Aortic valve regurgitation is mild. No  aortic stenosis is present.   6. Aortic dilatation noted. There is mild dilatation of the aortic root  and of the ascending aorta, measuring 41 mm.   7. The inferior vena cava is dilated in size with >50% respiratory  variability, suggesting right atrial pressure of 8 mmHg.    CHA2DS2-VASc Score = 2  The patient's score is based upon: CHF History: 0 HTN History: 1 Diabetes History: 0 Stroke History: 0 Vascular Disease History: 1 Age Score: 0 Gender Score: 0        ASSESSMENT AND PLAN: Paroxysmal Atrial Fibrillation/atrial flutter (ICD10:  I48.0) The patient's CHA2DS2-VASc score is 2, indicating a 2.2% annual risk of stroke.   S/p afib and flutter ablation 02/08/24 Patient appears to be maintaining SR Continue Xarelto  20 mg daily (holding for hip surgery starting today) Continue diltiazem  240 mg daily  Secondary Hypercoagulable State (ICD10:  D68.69) The patient is at significant risk for stroke/thromboembolism based upon his CHA2DS2-VASc Score of 2.  Continue Rivaroxaban  (Xarelto ). No bleeding issues.   CAD CAC score 1184 No anginal symptoms Followed by Dr Sheena  HTN Mildly elevated today. Has been borderline at home. Can revisit after hip surgery. May need to increase lisinopril .   OSA  Encouraged nightly CPAP   Follow up with Dr Inocencio in 6 months.    Daril [facility] [address] [place], [place] [address]  [phone] °"

## 2024-05-15 ENCOUNTER — Ambulatory Visit (HOSPITAL_COMMUNITY)

## 2024-05-15 ENCOUNTER — Ambulatory Visit (HOSPITAL_BASED_OUTPATIENT_CLINIC_OR_DEPARTMENT_OTHER): Payer: Self-pay

## 2024-05-15 ENCOUNTER — Observation Stay (HOSPITAL_COMMUNITY)

## 2024-05-15 ENCOUNTER — Encounter (HOSPITAL_COMMUNITY): Payer: Self-pay | Admitting: Orthopaedic Surgery

## 2024-05-15 ENCOUNTER — Encounter (HOSPITAL_COMMUNITY)
Admission: RE | Disposition: A | Discharge status: Home / Self Care | Payer: Self-pay | Source: Home / Self Care | Attending: Orthopaedic Surgery

## 2024-05-15 ENCOUNTER — Other Ambulatory Visit: Payer: Self-pay

## 2024-05-15 ENCOUNTER — Ambulatory Visit (HOSPITAL_COMMUNITY): Payer: Self-pay | Admitting: Physician Assistant

## 2024-05-15 ENCOUNTER — Observation Stay (HOSPITAL_COMMUNITY)
Admission: RE | Admit: 2024-05-15 | Discharge: 2024-05-15 | Disposition: A | Discharge status: Home / Self Care | Attending: Orthopaedic Surgery | Admitting: Orthopaedic Surgery

## 2024-05-15 DIAGNOSIS — Z96641 Presence of right artificial hip joint: Secondary | ICD-10-CM | POA: Insufficient documentation

## 2024-05-15 DIAGNOSIS — M1612 Unilateral primary osteoarthritis, left hip: Secondary | ICD-10-CM

## 2024-05-15 DIAGNOSIS — Z96642 Presence of left artificial hip joint: Secondary | ICD-10-CM

## 2024-05-15 DIAGNOSIS — M25452 Effusion, left hip: Secondary | ICD-10-CM | POA: Insufficient documentation

## 2024-05-15 DIAGNOSIS — M67352 Transient synovitis, left hip: Secondary | ICD-10-CM | POA: Insufficient documentation

## 2024-05-15 DIAGNOSIS — I2510 Atherosclerotic heart disease of native coronary artery without angina pectoris: Secondary | ICD-10-CM | POA: Diagnosis not present

## 2024-05-15 DIAGNOSIS — I10 Essential (primary) hypertension: Secondary | ICD-10-CM | POA: Insufficient documentation

## 2024-05-15 DIAGNOSIS — Z79899 Other long term (current) drug therapy: Secondary | ICD-10-CM | POA: Diagnosis not present

## 2024-05-15 DIAGNOSIS — E7849 Other hyperlipidemia: Secondary | ICD-10-CM | POA: Diagnosis not present

## 2024-05-15 DIAGNOSIS — Z01818 Encounter for other preprocedural examination: Secondary | ICD-10-CM

## 2024-05-15 HISTORY — PX: TOTAL HIP ARTHROPLASTY: SHX124

## 2024-05-15 SURGERY — ARTHROPLASTY, HIP, TOTAL, ANTERIOR APPROACH
Anesthesia: Spinal | Site: Hip | Laterality: Left

## 2024-05-15 MED ORDER — MUPIROCIN 2 % EX OINT: 1.0000 | TOPICAL_OINTMENT | Freq: Two times a day (BID) | CUTANEOUS | 0 refills | Status: AC

## 2024-05-15 MED ORDER — CEFAZOLIN SODIUM-DEXTROSE 2-4 GM/100ML-% IV SOLN
2.0000 g | INTRAVENOUS | Status: AC
  Administered 2024-05-15: 2 g via INTRAVENOUS
  Filled 2024-05-15: qty 100

## 2024-05-15 MED ORDER — ONDANSETRON HCL 4 MG/2ML IJ SOLN: 4.0000 mg | Freq: Four times a day (QID) | INTRAMUSCULAR | Status: DC | PRN

## 2024-05-15 MED ORDER — DIPHENHYDRAMINE HCL 12.5 MG/5ML PO ELIX: 25.0000 mg | ORAL_SOLUTION | ORAL | Status: DC | PRN

## 2024-05-15 MED ORDER — OXYCODONE HCL 5 MG PO TABS: 5.0000 mg | ORAL_TABLET | Freq: Once | ORAL | Status: DC | PRN

## 2024-05-15 MED ORDER — PANTOPRAZOLE SODIUM 40 MG PO TBEC
40.0000 mg | DELAYED_RELEASE_TABLET | Freq: Every day | ORAL | Status: DC
  Administered 2024-05-15: 40 mg via ORAL
  Filled 2024-05-15: qty 1

## 2024-05-15 MED ORDER — CHLORHEXIDINE GLUCONATE 4 % EX SOLN: 1.0000 | CUTANEOUS | 1 refills | Status: AC

## 2024-05-15 MED ORDER — METOCLOPRAMIDE HCL 5 MG/ML IJ SOLN: 5.0000 mg | Freq: Three times a day (TID) | INTRAMUSCULAR | Status: DC | PRN

## 2024-05-15 MED ORDER — FENTANYL CITRATE (PF) 100 MCG/2ML IJ SOLN: 25.0000 ug | INTRAMUSCULAR | Status: DC | PRN

## 2024-05-15 MED ORDER — SODIUM CHLORIDE 0.9 % IR SOLN
Status: DC | PRN
  Administered 2024-05-15: 1000 mL

## 2024-05-15 MED ORDER — DROPERIDOL 2.5 MG/ML IJ SOLN: 0.6250 mg | Freq: Once | INTRAMUSCULAR | Status: DC | PRN

## 2024-05-15 MED ORDER — PROPOFOL 500 MG/50ML IV EMUL
INTRAVENOUS | Status: DC | PRN
  Administered 2024-05-15: 100 ug/kg/min via INTRAVENOUS

## 2024-05-15 MED ORDER — TRANEXAMIC ACID 1000 MG/10ML IV SOLN
INTRAVENOUS | Status: DC | PRN
  Administered 2024-05-15: 2000 mg via TOPICAL

## 2024-05-15 MED ORDER — ONDANSETRON HCL 4 MG/2ML IJ SOLN
INTRAMUSCULAR | Status: DC | PRN
  Administered 2024-05-15: 4 mg via INTRAVENOUS

## 2024-05-15 MED ORDER — ACETAMINOPHEN 325 MG PO TABS: 325.0000 mg | ORAL_TABLET | Freq: Four times a day (QID) | ORAL | Status: DC | PRN

## 2024-05-15 MED ORDER — VANCOMYCIN HCL 1 G IV SOLR
INTRAVENOUS | Status: DC | PRN
  Administered 2024-05-15: 1000 mg via TOPICAL

## 2024-05-15 MED ORDER — BUPIVACAINE-MELOXICAM ER 400-12 MG/14ML IJ SOLN
INTRAMUSCULAR | Status: AC
  Filled 2024-05-15: qty 1

## 2024-05-15 MED ORDER — RIVAROXABAN 20 MG PO TABS: 20.0000 mg | ORAL_TABLET | Freq: Every morning | ORAL | Status: DC

## 2024-05-15 MED ORDER — MORPHINE SULFATE (PF) 2 MG/ML IV SOLN: 0.5000 mg | Freq: Four times a day (QID) | INTRAVENOUS | Status: DC | PRN

## 2024-05-15 MED ORDER — LISINOPRIL 20 MG PO TABS
20.0000 mg | ORAL_TABLET | Freq: Every morning | ORAL | Status: DC
  Administered 2024-05-15: 20 mg via ORAL
  Filled 2024-05-15: qty 1

## 2024-05-15 MED ORDER — ACETAMINOPHEN 500 MG PO TABS
1000.0000 mg | ORAL_TABLET | Freq: Once | ORAL | Status: AC
  Administered 2024-05-15: 1000 mg via ORAL
  Filled 2024-05-15: qty 2

## 2024-05-15 MED ORDER — SORBITOL 70 % SOLN: 30.0000 mL | Freq: Every day | Status: DC | PRN

## 2024-05-15 MED ORDER — VANCOMYCIN HCL 1000 MG IV SOLR
INTRAVENOUS | Status: AC
  Filled 2024-05-15: qty 20

## 2024-05-15 MED ORDER — METHOCARBAMOL 1000 MG/10ML IJ SOLN: 500.0000 mg | Freq: Four times a day (QID) | INTRAMUSCULAR | Status: DC | PRN

## 2024-05-15 MED ORDER — ACETAMINOPHEN 10 MG/ML IV SOLN: 1000.0000 mg | Freq: Once | INTRAVENOUS | Status: DC | PRN

## 2024-05-15 MED ORDER — BUPIVACAINE IN DEXTROSE 0.75-8.25 % IT SOLN
INTRATHECAL | Status: DC | PRN
  Administered 2024-05-15: 1.8 mL via INTRATHECAL

## 2024-05-15 MED ORDER — HYDROCODONE-ACETAMINOPHEN 5-325 MG PO TABS: 1.0000 | ORAL_TABLET | Freq: Three times a day (TID) | ORAL | Status: DC | PRN

## 2024-05-15 MED ORDER — LACTATED RINGERS IV SOLN: INTRAVENOUS | Status: DC

## 2024-05-15 MED ORDER — OXYCODONE HCL 5 MG/5ML PO SOLN: 5.0000 mg | Freq: Once | ORAL | Status: DC | PRN

## 2024-05-15 MED ORDER — MENTHOL 3 MG MT LOZG: 1.0000 | LOZENGE | OROMUCOSAL | Status: DC | PRN

## 2024-05-15 MED ORDER — PHENYLEPHRINE 80 MCG/ML (10ML) SYRINGE FOR IV PUSH (FOR BLOOD PRESSURE SUPPORT)
PREFILLED_SYRINGE | INTRAVENOUS | Status: AC
  Filled 2024-05-15: qty 10

## 2024-05-15 MED ORDER — TRANEXAMIC ACID-NACL 1000-0.7 MG/100ML-% IV SOLN
1000.0000 mg | Freq: Once | INTRAVENOUS | Status: AC
  Administered 2024-05-15: 1000 mg via INTRAVENOUS
  Filled 2024-05-15: qty 100

## 2024-05-15 MED ORDER — 0.9 % SODIUM CHLORIDE (POUR BTL) OPTIME
TOPICAL | Status: DC | PRN
  Administered 2024-05-15: 1000 mL

## 2024-05-15 MED ORDER — MIDAZOLAM HCL 2 MG/2ML IJ SOLN
INTRAMUSCULAR | Status: AC
  Filled 2024-05-15: qty 2

## 2024-05-15 MED ORDER — DEXAMETHASONE SOD PHOSPHATE PF 10 MG/ML IJ SOLN
INTRAMUSCULAR | Status: DC | PRN
  Administered 2024-05-15: 8 mg via INTRAVENOUS

## 2024-05-15 MED ORDER — ALUM & MAG HYDROXIDE-SIMETH 200-200-20 MG/5ML PO SUSP: 30.0000 mL | ORAL | Status: DC | PRN

## 2024-05-15 MED ORDER — DILTIAZEM HCL ER COATED BEADS 240 MG PO CP24: 240.0000 mg | ORAL_CAPSULE | Freq: Every day | ORAL | Status: DC

## 2024-05-15 MED ORDER — SODIUM CHLORIDE 0.9 % IV SOLN: INTRAVENOUS | Status: DC

## 2024-05-15 MED ORDER — FENTANYL CITRATE (PF) 250 MCG/5ML IJ SOLN
INTRAMUSCULAR | Status: DC | PRN
  Administered 2024-05-15: 100 ug via INTRAVENOUS

## 2024-05-15 MED ORDER — MAGNESIUM CITRATE PO SOLN: 1.0000 | Freq: Once | ORAL | Status: DC | PRN

## 2024-05-15 MED ORDER — POVIDONE-IODINE 10 % EX SWAB
2.0000 | Freq: Once | CUTANEOUS | Status: AC
  Administered 2024-05-15: 2 via TOPICAL

## 2024-05-15 MED ORDER — DOCUSATE SODIUM 100 MG PO CAPS
100.0000 mg | ORAL_CAPSULE | Freq: Two times a day (BID) | ORAL | Status: DC
  Administered 2024-05-15: 100 mg via ORAL
  Filled 2024-05-15: qty 1

## 2024-05-15 MED ORDER — MIDAZOLAM HCL (PF) 2 MG/2ML IJ SOLN
INTRAMUSCULAR | Status: DC | PRN
  Administered 2024-05-15: 2 mg via INTRAVENOUS

## 2024-05-15 MED ORDER — CHLORHEXIDINE GLUCONATE 0.12 % MT SOLN
15.0000 mL | Freq: Once | OROMUCOSAL | Status: AC
  Administered 2024-05-15: 15 mL via OROMUCOSAL
  Filled 2024-05-15: qty 15

## 2024-05-15 MED ORDER — ACETAMINOPHEN 500 MG PO TABS
1000.0000 mg | ORAL_TABLET | Freq: Four times a day (QID) | ORAL | Status: DC
  Administered 2024-05-15: 1000 mg via ORAL
  Filled 2024-05-15: qty 2

## 2024-05-15 MED ORDER — DEXAMETHASONE SOD PHOSPHATE PF 10 MG/ML IJ SOLN: 10.0000 mg | Freq: Once | INTRAMUSCULAR | Status: DC

## 2024-05-15 MED ORDER — FENTANYL CITRATE (PF) 250 MCG/5ML IJ SOLN
INTRAMUSCULAR | Status: AC
  Filled 2024-05-15: qty 5

## 2024-05-15 MED ORDER — BUPIVACAINE-MELOXICAM ER 400-12 MG/14ML IJ SOLN
INTRAMUSCULAR | Status: DC | PRN
  Administered 2024-05-15: 400 mg

## 2024-05-15 MED ORDER — PROPOFOL 10 MG/ML IV BOLUS
INTRAVENOUS | Status: DC | PRN
  Administered 2024-05-15: 5 mg via INTRAVENOUS
  Administered 2024-05-15: 35 mg via INTRAVENOUS

## 2024-05-15 MED ORDER — LACTATED RINGERS IV BOLUS
250.0000 mL | Freq: Once | INTRAVENOUS | Status: AC
  Administered 2024-05-15: 250 mL via INTRAVENOUS

## 2024-05-15 MED ORDER — ONDANSETRON HCL 4 MG PO TABS: 4.0000 mg | ORAL_TABLET | Freq: Four times a day (QID) | ORAL | Status: DC | PRN

## 2024-05-15 MED ORDER — TRANEXAMIC ACID-NACL 1000-0.7 MG/100ML-% IV SOLN
1000.0000 mg | INTRAVENOUS | Status: AC
  Administered 2024-05-15: 1000 mg via INTRAVENOUS
  Filled 2024-05-15: qty 100

## 2024-05-15 MED ORDER — PHENOL 1.4 % MT LIQD: 1.0000 | OROMUCOSAL | Status: DC | PRN

## 2024-05-15 MED ORDER — ORAL CARE MOUTH RINSE: 15.0000 mL | Freq: Once | OROMUCOSAL | Status: AC

## 2024-05-15 MED ORDER — HYDROCODONE-ACETAMINOPHEN 7.5-325 MG PO TABS
1.0000 | ORAL_TABLET | Freq: Three times a day (TID) | ORAL | Status: DC | PRN
  Administered 2024-05-15: 2 via ORAL
  Filled 2024-05-15: qty 2

## 2024-05-15 MED ORDER — LACTATED RINGERS IV BOLUS
500.0000 mL | Freq: Once | INTRAVENOUS | Status: AC
  Administered 2024-05-15: 500 mL via INTRAVENOUS

## 2024-05-15 MED ORDER — CEFAZOLIN SODIUM-DEXTROSE 2-4 GM/100ML-% IV SOLN
2.0000 g | Freq: Four times a day (QID) | INTRAVENOUS | Status: DC
  Administered 2024-05-15: 2 g via INTRAVENOUS
  Filled 2024-05-15: qty 100

## 2024-05-15 MED ORDER — METHOCARBAMOL 500 MG PO TABS
500.0000 mg | ORAL_TABLET | Freq: Four times a day (QID) | ORAL | Status: DC | PRN
  Administered 2024-05-15: 500 mg via ORAL
  Filled 2024-05-15: qty 1

## 2024-05-15 MED ORDER — POLYETHYLENE GLYCOL 3350 17 G PO PACK: 17.0000 g | PACK | Freq: Every day | ORAL | Status: DC

## 2024-05-15 MED ORDER — ONDANSETRON HCL 4 MG/2ML IJ SOLN
INTRAMUSCULAR | Status: AC
  Filled 2024-05-15: qty 2

## 2024-05-15 MED ORDER — SERTRALINE HCL 50 MG PO TABS: 50.0000 mg | ORAL_TABLET | Freq: Every morning | ORAL | Status: DC

## 2024-05-15 MED ORDER — PRONTOSAN WOUND IRRIGATION OPTIME
TOPICAL | Status: DC | PRN
  Administered 2024-05-15: 450 mL via TOPICAL

## 2024-05-15 MED ORDER — METOCLOPRAMIDE HCL 5 MG PO TABS: 5.0000 mg | ORAL_TABLET | Freq: Three times a day (TID) | ORAL | Status: DC | PRN

## 2024-05-15 SURGICAL SUPPLY — 48 items
BAG COUNTER SPONGE SURGICOUNT (BAG) ×1 IMPLANT
BAG DECANTER FOR FLEXI CONT (MISCELLANEOUS) ×1 IMPLANT
BLADE SAG 18X100X1.27 (BLADE) ×1 IMPLANT
COVER PERINEAL POST (MISCELLANEOUS) ×1 IMPLANT
COVER SURGICAL LIGHT HANDLE (MISCELLANEOUS) ×1 IMPLANT
CUP ACETAB W/GRIPTION 54 (Plate) IMPLANT
DERMABOND ADVANCED .7 DNX12 (GAUZE/BANDAGES/DRESSINGS) IMPLANT
DRAPE C-ARM 42X72 X-RAY (DRAPES) ×1 IMPLANT
DRAPE POUCH INSTRU U-SHP 10X18 (DRAPES) ×1 IMPLANT
DRAPE STERI IOBAN 125X83 (DRAPES) ×1 IMPLANT
DRAPE U-SHAPE 47X51 STRL (DRAPES) ×2 IMPLANT
DRESSING AQUACEL AG SP 3.5X10 (GAUZE/BANDAGES/DRESSINGS) IMPLANT
DRSG AQUACEL AG ADV 3.5X10 (GAUZE/BANDAGES/DRESSINGS) ×1 IMPLANT
DURAPREP 26ML APPLICATOR (WOUND CARE) ×2 IMPLANT
ELECTRODE BLDE 4.0 EZ CLN MEGD (MISCELLANEOUS) ×1 IMPLANT
ELECTRODE REM PT RTRN 9FT ADLT (ELECTROSURGICAL) ×1 IMPLANT
GLOVE BIOGEL PI IND STRL 7.0 (GLOVE) ×2 IMPLANT
GLOVE BIOGEL PI IND STRL 7.5 (GLOVE) ×1 IMPLANT
GLOVE BIOGEL PI MICRO STRL 7 (GLOVE) ×5 IMPLANT
GLOVE INDICATOR 7.0 STRL GRN (GLOVE) ×1 IMPLANT
GLOVE SURG SYN 7.5 PF PI (GLOVE) ×5 IMPLANT
GOWN STRL REUS W/ TWL LRG LVL3 (GOWN DISPOSABLE) IMPLANT
GOWN STRL REUS W/ TWL XL LVL3 (GOWN DISPOSABLE) ×1 IMPLANT
GOWN STRL SURGICAL XL XLNG (GOWN DISPOSABLE) ×1 IMPLANT
GOWN TOGA ZIPPER T7+ PEEL AWAY (MISCELLANEOUS) ×1 IMPLANT
HEAD CERAMIC 36 PLUS5 (Hips) IMPLANT
HOOD PEEL AWAY T7 (MISCELLANEOUS) ×1 IMPLANT
IV 0.9% NACL 1000 ML (IV SOLUTION) ×1 IMPLANT
KIT BASIN OR (CUSTOM PROCEDURE TRAY) ×1 IMPLANT
LINER NEUTRAL 54X36MM PLUS 4 (Hips) IMPLANT
MARKER SKIN DUAL TIP RULER LAB (MISCELLANEOUS) ×1 IMPLANT
NEEDLE SPNL 18GX3.5 QUINCKE PK (NEEDLE) ×1 IMPLANT
PACK TOTAL JOINT (CUSTOM PROCEDURE TRAY) ×1 IMPLANT
PACK UNIVERSAL I (CUSTOM PROCEDURE TRAY) ×1 IMPLANT
SCREW 6.5MMX25MM (Screw) IMPLANT
SET HNDPC FAN SPRY TIP SCT (DISPOSABLE) ×1 IMPLANT
SOLUTION PRONTOSAN WOUND 350ML (IRRIGATION / IRRIGATOR) ×1 IMPLANT
STEM FEMORAL SZ6 HIGH ACTIS (Stem) IMPLANT
SUT ETHIBOND 2 V 37 (SUTURE) ×1 IMPLANT
SUT STRATAFIX PDS+ 0 24IN (SUTURE) IMPLANT
SUT VIC AB 0 CT1 27XBRD ANBCTR (SUTURE) ×1 IMPLANT
SUT VIC AB 1 CTX36XBRD ANBCTR (SUTURE) ×1 IMPLANT
SUT VIC AB 2-0 CT1 TAPERPNT 27 (SUTURE) ×2 IMPLANT
SYR 30ML LL (SYRINGE) ×2 IMPLANT
TOWEL GREEN STERILE (TOWEL DISPOSABLE) ×1 IMPLANT
TRAY FOLEY W/BAG SLVR 16FR ST (SET/KITS/TRAYS/PACK) IMPLANT
TUBE SUCT ARGYLE STRL (TUBING) ×1 IMPLANT
YANKAUER SUCT BULB TIP NO VENT (SUCTIONS) ×1 IMPLANT

## 2024-05-15 NOTE — Telephone Encounter (Signed)
 approve

## 2024-05-15 NOTE — Anesthesia Postprocedure Evaluation (Signed)
"   Anesthesia Post Note  Patient: DOUGLAS SMOLINSKY  Procedure(s) Performed: ARTHROPLASTY, HIP, TOTAL, ANTERIOR APPROACH, LEFT (Left: Hip)     Patient location during evaluation: PACU Anesthesia Type: Spinal Level of consciousness: oriented and awake and alert Pain management: pain level controlled Vital Signs Assessment: post-procedure vital signs reviewed and stable Respiratory status: spontaneous breathing, respiratory function stable and patient connected to nasal cannula oxygen Cardiovascular status: blood pressure returned to baseline and stable Postop Assessment: no headache, no backache and no apparent nausea or vomiting Anesthetic complications: no   No notable events documented.  Last Vitals:  Vitals:   05/15/24 1000 05/15/24 1020  BP: 132/81 129/83  Pulse: (!) 55 (!) 57  Resp: 13 16  Temp: 36.7 C 36.8 C  SpO2: 95% 96%    Last Pain:  Vitals:   05/15/24 1347  TempSrc:   PainSc: 7                  Thom JONELLE Peoples      "

## 2024-05-15 NOTE — Plan of Care (Signed)
 " Problem: Education: Goal: Knowledge of General Education information will improve Description: Including pain rating scale, medication(s)/side effects and non-pharmacologic comfort measures 05/15/2024 1622 by Sherleen Flor, RN Outcome: Completed/Met 05/15/2024 1122 by Sherleen Flor, RN Outcome: Progressing   Problem: Health Behavior/Discharge Planning: Goal: Ability to manage health-related needs will improve 05/15/2024 1622 by Sherleen Flor, RN Outcome: Completed/Met 05/15/2024 1122 by Sherleen Flor, RN Outcome: Progressing   Problem: Clinical Measurements: Goal: Ability to maintain clinical measurements within normal limits will improve 05/15/2024 1622 by Sherleen Flor, RN Outcome: Completed/Met 05/15/2024 1122 by Sherleen Flor, RN Outcome: Progressing Goal: Will remain free from infection 05/15/2024 1622 by Sherleen Flor, RN Outcome: Completed/Met 05/15/2024 1122 by Sherleen Flor, RN Outcome: Progressing Goal: Diagnostic test results will improve 05/15/2024 1622 by Sherleen Flor, RN Outcome: Completed/Met 05/15/2024 1122 by Sherleen Flor, RN Outcome: Progressing Goal: Respiratory complications will improve 05/15/2024 1622 by Sherleen Flor, RN Outcome: Completed/Met 05/15/2024 1122 by Sherleen Flor, RN Outcome: Progressing Goal: Cardiovascular complication will be avoided 05/15/2024 1622 by Sherleen Flor, RN Outcome: Completed/Met 05/15/2024 1122 by Sherleen Flor, RN Outcome: Progressing   Problem: Activity: Goal: Risk for activity intolerance will decrease 05/15/2024 1622 by Sherleen Flor, RN Outcome: Completed/Met 05/15/2024 1122 by Sherleen Flor, RN Outcome: Progressing   Problem: Nutrition: Goal: Adequate nutrition will be maintained 05/15/2024 1622 by Sherleen Flor, RN Outcome: Completed/Met 05/15/2024 1122 by Sherleen Flor, RN Outcome: Progressing   Problem: Coping: Goal: Level of anxiety will  decrease 05/15/2024 1622 by Sherleen Flor, RN Outcome: Completed/Met 05/15/2024 1122 by Sherleen Flor, RN Outcome: Progressing   Problem: Elimination: Goal: Will not experience complications related to bowel motility 05/15/2024 1622 by Sherleen Flor, RN Outcome: Completed/Met 05/15/2024 1122 by Sherleen Flor, RN Outcome: Progressing Goal: Will not experience complications related to urinary retention 05/15/2024 1622 by Sherleen Flor, RN Outcome: Completed/Met 05/15/2024 1122 by Sherleen Flor, RN Outcome: Progressing   Problem: Pain Managment: Goal: General experience of comfort will improve and/or be controlled 05/15/2024 1622 by Sherleen Flor, RN Outcome: Completed/Met 05/15/2024 1122 by Sherleen Flor, RN Outcome: Progressing   Problem: Safety: Goal: Ability to remain free from injury will improve 05/15/2024 1622 by Sherleen Flor, RN Outcome: Completed/Met 05/15/2024 1122 by Sherleen Flor, RN Outcome: Progressing   Problem: Skin Integrity: Goal: Risk for impaired skin integrity will decrease 05/15/2024 1622 by Sherleen Flor, RN Outcome: Completed/Met 05/15/2024 1122 by Sherleen Flor, RN Outcome: Progressing   Problem: Education: Goal: Knowledge of the prescribed therapeutic regimen will improve 05/15/2024 1622 by Sherleen Flor, RN Outcome: Completed/Met 05/15/2024 1122 by Sherleen Flor, RN Outcome: Progressing   Problem: Bowel/Gastric: Goal: Gastrointestinal status for postoperative course will improve 05/15/2024 1622 by Sherleen Flor, RN Outcome: Completed/Met 05/15/2024 1122 by Sherleen Flor, RN Outcome: Progressing   Problem: Cardiac: Goal: Ability to maintain an adequate cardiac output 05/15/2024 1622 by Sherleen Flor, RN Outcome: Completed/Met 05/15/2024 1122 by Sherleen Flor, RN Outcome: Progressing Goal: Will show no evidence of cardiac arrhythmias 05/15/2024 1622 by Sherleen Flor,  RN Outcome: Completed/Met 05/15/2024 1122 by Sherleen Flor, RN Outcome: Progressing   Problem: Nutritional: Goal: Will attain and maintain optimal nutritional status 05/15/2024 1622 by Sherleen Flor, RN Outcome: Completed/Met 05/15/2024 1122 by Sherleen Flor, RN Outcome: Progressing   Problem: Neurological: Goal: Will regain or maintain usual level of consciousness 05/15/2024 1622 by Sherleen Flor, RN Outcome: Completed/Met 05/15/2024 1122 by Sherleen Flor, RN Outcome: Progressing   Problem: Clinical Measurements: Goal: Ability to maintain clinical measurements within normal limits 05/15/2024 1622 by Sherleen Flor, RN Outcome: Completed/Met 05/15/2024  1122 by Sherleen Flor, RN Outcome: Progressing Goal: Postoperative complications will be avoided or minimized 05/15/2024 1622 by Sherleen Flor, RN Outcome: Completed/Met 05/15/2024 1122 by Sherleen Flor, RN Outcome: Progressing   Problem: Respiratory: Goal: Will regain and/or maintain adequate ventilation 05/15/2024 1622 by Sherleen Flor, RN Outcome: Completed/Met 05/15/2024 1122 by Sherleen Flor, RN Outcome: Progressing Goal: Respiratory status will improve 05/15/2024 1622 by Sherleen Flor, RN Outcome: Completed/Met 05/15/2024 1122 by Sherleen Flor, RN Outcome: Progressing   Problem: Skin Integrity: Goal: Demonstrates signs of wound healing without infection 05/15/2024 1622 by Sherleen Flor, RN Outcome: Completed/Met 05/15/2024 1122 by Sherleen Flor, RN Outcome: Progressing   Problem: Urinary Elimination: Goal: Will remain free from infection 05/15/2024 1622 by Sherleen Flor, RN Outcome: Completed/Met 05/15/2024 1122 by Sherleen Flor, RN Outcome: Progressing Goal: Ability to achieve and maintain adequate urine output 05/15/2024 1622 by Sherleen Flor, RN Outcome: Completed/Met 05/15/2024 1122 by Sherleen Flor, RN Outcome: Progressing   Problem:  Education: Goal: Knowledge of the prescribed therapeutic regimen will improve 05/15/2024 1622 by Sherleen Flor, RN Outcome: Completed/Met 05/15/2024 1122 by Sherleen Flor, RN Outcome: Progressing Goal: Understanding of discharge needs will improve 05/15/2024 1622 by Sherleen Flor, RN Outcome: Completed/Met 05/15/2024 1122 by Sherleen Flor, RN Outcome: Progressing Goal: Individualized Educational Video(s) 05/15/2024 1622 by Sherleen Flor, RN Outcome: Completed/Met 05/15/2024 1122 by Sherleen Flor, RN Outcome: Progressing   Problem: Activity: Goal: Ability to avoid complications of mobility impairment will improve 05/15/2024 1622 by Sherleen Flor, RN Outcome: Completed/Met 05/15/2024 1122 by Sherleen Flor, RN Outcome: Progressing Goal: Ability to tolerate increased activity will improve 05/15/2024 1622 by Sherleen Flor, RN Outcome: Completed/Met 05/15/2024 1122 by Sherleen Flor, RN Outcome: Progressing   Problem: Clinical Measurements: Goal: Postoperative complications will be avoided or minimized 05/15/2024 1622 by Sherleen Flor, RN Outcome: Completed/Met 05/15/2024 1122 by Sherleen Flor, RN Outcome: Progressing   Problem: Pain Management: Goal: Pain level will decrease with appropriate interventions 05/15/2024 1622 by Sherleen Flor, RN Outcome: Completed/Met 05/15/2024 1122 by Sherleen Flor, RN Outcome: Progressing   Problem: Skin Integrity: Goal: Will show signs of wound healing 05/15/2024 1622 by Sherleen Flor, RN Outcome: Completed/Met 05/15/2024 1122 by Sherleen Flor, RN Outcome: Progressing   "

## 2024-05-15 NOTE — Discharge Instructions (Signed)

## 2024-05-15 NOTE — Op Note (Signed)
 "  ARTHROPLASTY, HIP, TOTAL, ANTERIOR APPROACH, LEFT  Procedure Note [name] [name]   [id number]  Pre-op  Diagnosis: left hip osteoarthritis     Post-op Diagnosis: same  Operative Findings Severe OA Joint effusion and synovitis   Operative Procedures  1. Total hip replacement; Left hip; uncemented [id number]   Surgeon: Kay Cummins, M.D.  Assist: Ronal Morna Grave, PA-C   Anesthesia: spinal  Prosthesis: Depuy Acetabulum: Pinnacle 54 mm Femur: Actis 6 HO Head: 36 mm size: +5 Liner: +4 neutral Bearing Type: ceramic/poly  Total Hip Arthroplasty (Anterior Approach) Op Note:  After informed consent was obtained and the operative extremity marked in the holding area, the patient was brought back to the operating room and placed supine on the HANA table. Next, the operative extremity was prepped and draped in normal sterile fashion. Surgical timeout occurred verifying patient identification, surgical site, surgical procedure and administration of antibiotics.  A 10 cm longitudinal incision was made starting from 2 fingerbreadths lateral and inferior to the ASIS towards the lateral aspect of the patella.  A Hueter approach to the hip was performed, using the interval between tensor fascia lata and sartorius.  Dissection was carried bluntly down onto the anterior hip capsule. The lateral femoral circumflex vessels were identified and coagulated. A capsulotomy was performed and the capsular flaps tagged for later repair.  The neck osteotomy was performed. The femoral head was removed which showed disease, the acetabular rim was cleared of soft tissue and osteophytes and attention was turned to reaming the acetabulum.  Sequential reaming was performed under fluoroscopic guidance down to the floor of the cotyloid fossa. We reamed to a size 53 mm, and then impacted the acetabular shell. A 25 mm cancellous screw was placed to secure the shell.  A +4 neutral liner was then placed after irrigation  and attention turned to the femur.  After placing the femoral hook, the leg was taken to externally rotated, extended and adducted position taking care to perform soft tissue releases to allow for adequate mobilization of the femur. Soft tissue was cleared from the shoulder of the greater trochanter and the hook elevator used to improve exposure of the proximal femur.  Lateral bone from the shoulder was rasped away for relief.  Sequential broaching performed up to a size 6.  High offset trial neck and +1.5 head were placed. The leg was brought back up to neutral and the construct reduced.  The position and sizing of components, offset and leg lengths were checked using fluoroscopy.  Based on fluoroscopic findings, we chose to retrial with high offset neck and +5 head ball.  Stability of the construct was checked in 45 degrees of hip extension and 90 degrees of external rotation without any subluxation, shuck or impingement of prosthesis. We dislocated the prosthesis, dropped the leg back into position, removed trial components, and irrigated copiously. The final stem and head were chosen then placed, the leg brought back up, the system reduced and fluoroscopy used to verify positioning.  Antibiotic irrigation was placed in the surgical wound.   We irrigated, obtained hemostasis and closed the capsule using #2 ethibond suture.  A topical mixture of 0.25% bupivacaine  and meloxicam  was placed deep to the fascia.  One gram of vancomycin  powder was placed in the surgical bed.   One gram of topical tranexamic acid  was injected into the joint.  The fascia was closed with #1 stratafix, the deep fat layer was closed with 0 vicryl, the subcutaneous layers closed with 2.0  Vicryl Plus and the skin closed with 2.0 nylon and dermabond. A sterile dressing was applied. The patient was awakened in the operating room and taken to recovery in stable condition.  All sponge, needle, and instrument counts were correct at the end of  the case.   Morna Grave, my PA, was a medical necessity for opening, closing, limb positioning, retracting, exposing, and overall facilitation and timely completion of the surgery.  Position: supine  Complications: see description of procedure.  Time Out: performed   Drains/Packing: none  Estimated blood loss: see anesthesia record  Returned to Recovery Room: in good condition.   Antibiotics: yes   Mechanical VTE (DVT) Prophylaxis: sequential compression devices, TED thigh-high  Chemical VTE (DVT) Prophylaxis: resume xarelto  POD 1   Fluid Replacement: see anesthesia record  Specimens Removed: 1 to pathology   Sponge and Instrument Count Correct? yes   PACU: portable radiograph - low AP   Plan/RTC: Return in 2 weeks for suture removal. Weight Bearing/Load Lower Extremity: full  Hip precautions: none Suture Removal: 2 weeks   N. Ozell Cummins, MD [facility] 8:51 AM   Implant Name Type Inv. Item Serial No. Manufacturer Lot No. LRB No. Used Action  CUP ACETAB W/GRIPTION 54 - [id number] Plate CUP ACETAB W/GRIPTION 54  [employer] [id number] Left 1 Implanted  LINER NEUTRAL 54X36MM PLUS 4 - [id number] Hips LINER NEUTRAL 54X36MM PLUS 4  [employer] [id number] Left 1 Implanted  SCREW 6.5MMX25MM - [id number] Screw SCREW 6.5MMX25MM  [employer] [id number] Left 1 Implanted  HEAD CERAMIC 36 PLUS5 - [id number] Hips HEAD CERAMIC 36 PLUS5  [employer] [id number] Left 1 Implanted  STEM FEMORAL SZ6 HIGH ACTIS - [id number] Stem STEM FEMORAL SZ6 HIGH ACTIS  [employer] [id number] Left 1 Implanted   "

## 2024-05-15 NOTE — Transfer of Care (Signed)
 Immediate Anesthesia Transfer of Care Note  Patient: [name] [name]  Procedure(s) Performed: ARTHROPLASTY, HIP, TOTAL, ANTERIOR APPROACH, LEFT (Left: Hip)  Patient Location: PACU  Anesthesia Type:MAC and Spinal  Level of Consciousness: awake, alert , and oriented  Airway & Oxygen Therapy: Patient Spontanous Breathing and Patient connected to nasal cannula oxygen  Post-op Assessment: Report given to RN and Post -op Vital signs reviewed and stable  Post vital signs: Reviewed and stable  Last Vitals:  Vitals Value Taken Time  BP 110/70 05/15/24 09:19  Temp    Pulse 60 05/15/24 09:21  Resp 18 05/15/24 09:21  SpO2 94 % 05/15/24 09:21  Vitals shown include unfiled device data.  Last Pain:  Vitals:   05/15/24 0615  TempSrc:   PainSc: 0-No pain         Complications: No notable events documented.

## 2024-05-15 NOTE — Plan of Care (Signed)
" °  Problem: Education: Goal: Knowledge of General Education information will improve Description: Including pain rating scale, medication(s)/side effects and non-pharmacologic comfort measures Outcome: Progressing   Problem: Health Behavior/Discharge Planning: Goal: Ability to manage health-related needs will improve Outcome: Progressing   Problem: Clinical Measurements: Goal: Ability to maintain clinical measurements within normal limits will improve Outcome: Progressing Goal: Will remain free from infection Outcome: Progressing Goal: Diagnostic test results will improve Outcome: Progressing Goal: Respiratory complications will improve Outcome: Progressing Goal: Cardiovascular complication will be avoided Outcome: Progressing   Problem: Activity: Goal: Risk for activity intolerance will decrease Outcome: Progressing   Problem: Nutrition: Goal: Adequate nutrition will be maintained Outcome: Progressing   Problem: Coping: Goal: Level of anxiety will decrease Outcome: Progressing   Problem: Elimination: Goal: Will not experience complications related to bowel motility Outcome: Progressing Goal: Will not experience complications related to urinary retention Outcome: Progressing   Problem: Pain Managment: Goal: General experience of comfort will improve and/or be controlled Outcome: Progressing   Problem: Safety: Goal: Ability to remain free from injury will improve Outcome: Progressing   Problem: Skin Integrity: Goal: Risk for impaired skin integrity will decrease Outcome: Progressing   Problem: Education: Goal: Knowledge of the prescribed therapeutic regimen will improve Outcome: Progressing   Problem: Bowel/Gastric: Goal: Gastrointestinal status for postoperative course will improve Outcome: Progressing   Problem: Cardiac: Goal: Ability to maintain an adequate cardiac output Outcome: Progressing Goal: Will show no evidence of cardiac arrhythmias Outcome:  Progressing   Problem: Nutritional: Goal: Will attain and maintain optimal nutritional status Outcome: Progressing   Problem: Neurological: Goal: Will regain or maintain usual level of consciousness Outcome: Progressing   Problem: Clinical Measurements: Goal: Ability to maintain clinical measurements within normal limits Outcome: Progressing Goal: Postoperative complications will be avoided or minimized Outcome: Progressing   Problem: Respiratory: Goal: Will regain and/or maintain adequate ventilation Outcome: Progressing Goal: Respiratory status will improve Outcome: Progressing   Problem: Skin Integrity: Goal: Demonstrates signs of wound healing without infection Outcome: Progressing   Problem: Urinary Elimination: Goal: Will remain free from infection Outcome: Progressing Goal: Ability to achieve and maintain adequate urine output Outcome: Progressing   Problem: Education: Goal: Knowledge of the prescribed therapeutic regimen will improve Outcome: Progressing Goal: Understanding of discharge needs will improve Outcome: Progressing Goal: Individualized Educational Video(s) Outcome: Progressing   Problem: Activity: Goal: Ability to avoid complications of mobility impairment will improve Outcome: Progressing Goal: Ability to tolerate increased activity will improve Outcome: Progressing   Problem: Clinical Measurements: Goal: Postoperative complications will be avoided or minimized Outcome: Progressing   Problem: Pain Management: Goal: Pain level will decrease with appropriate interventions Outcome: Progressing   Problem: Skin Integrity: Goal: Will show signs of wound healing Outcome: Progressing   "

## 2024-05-15 NOTE — TOC Transition Note (Signed)
 Transition of Care [facility]) - Discharge Note   Patient Details  Name: [name] [name] MRN: [id number] Date of Birth: Oct 23, 1965  Transition of Care [facility]) CM/SW Contact:  Andrez JULIANNA George, RN Phone Number: 05/15/2024, 12:23 PM   Clinical Narrative:     Pt is discharging home with self care. Per therapy no home health therapy needs. MD office in agreement.  Pt has transportation home.   Final next level of care: Home/Self Care Barriers to Discharge: No Barriers Identified   Patient Goals and CMS Choice            Discharge Placement                       Discharge Plan and Services Additional resources added to the After Visit Summary for                                       Social Drivers of Health (SDOH) Interventions SDOH Screenings   Food Insecurity: No Food Insecurity (10/26/2022)  Housing: Low Risk (10/26/2022)  Transportation Needs: No Transportation Needs (10/26/2022)  Utilities: Not At Risk (10/26/2022)  Tobacco Use: Low Risk (05/04/2024)     Readmission Risk Interventions     No data to display

## 2024-05-15 NOTE — Evaluation (Signed)
 Physical Therapy Evaluation Patient Details Name: [name] [name] MRN: [id number] DOB: 1966-01-02 Today's Date: 05/15/2024  History of Present Illness  Pt is 58 year old presented to [facility] on  12/29 for L THR. PMH - arthritis, anxiety, DVT, htn, R THR  Clinical Impression  Pt is currently presenting as Mod I for bed mobility, sit to stand, gait with RW and stairs with rails and step to gait pattern per home set up. Pt was provided with supervision/CGA throughout due to recent surgery but did not require assistance. Pt is cleared from a physical therapy perspective for home with family once medically stable. Prior to hospitalization pt was ind. Spouse able to provide 24/7 assist at home. Recommending to follow physician recommendation for physical therapy once discharged from acute care hospital setting.           If plan is discharge home, recommend the following: Other (comment) (as needed)     Equipment Recommendations None recommended by PT     Functional Status Assessment Patient has had a recent decline in their functional status and demonstrates the ability to make significant improvements in function in a reasonable and predictable amount of time.     Precautions / Restrictions Precautions Precautions: None Recall of Precautions/Restrictions: Intact Restrictions Weight Bearing Restrictions Per Provider Order: No Other Position/Activity Restrictions: no restrictions per op note      Mobility  Bed Mobility Overal bed mobility: Modified Independent             General bed mobility comments: slight increase in time.    Transfers Overall transfer level: Modified independent Equipment used: Rolling walker (2 wheels)               General transfer comment: supervision provided for safety due to recent surgery. No dizziness/lightheadedness reported. Pt with good hand placement.    Ambulation/Gait Ambulation/Gait assistance: Modified independent (Device/Increase  time) Gait Distance (Feet): 250 Feet Assistive device: Rolling walker (2 wheels) Gait Pattern/deviations: Step-through pattern, Decreased stride length Gait velocity: decreased Gait velocity interpretation: 1.31 - 2.62 ft/sec, indicative of limited community ambulator   General Gait Details: no antalgic gait noted; CGA to supervision provided for safety due to recent surgery. Pt at Mod I did not need any assist.  Stairs Stairs: Yes Stairs assistance: Modified independent (Device/Increase time) Stair Management: One rail Right, One rail Left, Forwards, Step to pattern Number of Stairs: 10 General stair comments: R rail ascending, L rail descending, Step to gait pattern. CGA provided but pt did not need.      Balance Overall balance assessment: Modified Independent          Pertinent Vitals/Pain Pain Assessment Pain Assessment: No/denies pain    Home Living Family/patient expects to be discharged to:: Private residence Living Arrangements: Spouse/significant other Available Help at Discharge: Family;Available 24 hours/day Type of Home: House Home Access: Stairs to enter Entrance Stairs-Rails: Right;Left;Can reach both Entrance Stairs-Number of Steps: 2 Alternate Level Stairs-Number of Steps: flight Home Layout: Two level Home Equipment: Rolling Walker (2 wheels);BSC/3in1;Cane - single point      Prior Function Prior Level of Function : Independent/Modified Independent;Working/employed;Driving             Mobility Comments: no assistive device       Extremity/Trunk Assessment   Upper Extremity Assessment Upper Extremity Assessment: Overall WFL for tasks assessed    Lower Extremity Assessment Lower Extremity Assessment: LLE deficits/detail;Overall [name] for tasks assessed LLE Deficits / Details: recent THR    Cervical /  Trunk Assessment Cervical / Trunk Assessment: Normal  Communication   Communication Communication: No apparent difficulties     Cognition Arousal: Alert Behavior During Therapy: WFL for tasks assessed/performed   PT - Cognitive impairments: No apparent impairments     Following commands: Intact       Cueing Cueing Techniques: Verbal cues     General Comments General comments (skin integrity, edema, etc.): incision dressing intact and dry.    Exercises     Assessment/Plan    PT Assessment Patient needs continued PT services  PT Problem List Decreased mobility       PT Treatment Interventions DME instruction;Therapeutic exercise;Gait training;Balance training;Stair training;Functional mobility training;Therapeutic activities;Patient/family education    PT Goals (Current goals can be found in the Care Plan section)  Acute Rehab PT Goals Patient Stated Goal: go to OPPT PT Goal Formulation: With patient Time For Goal Achievement: 05/29/24 Potential to Achieve Goals: Good    Frequency 7X/week        AM-PAC PT 6 Clicks Mobility  Outcome Measure Help needed turning from your back to your side while in a flat bed without using bedrails?: None Help needed moving from lying on your back to sitting on the side of a flat bed without using bedrails?: None Help needed moving to and from a bed to a chair (including a wheelchair)?: None Help needed standing up from a chair using your arms (e.g., wheelchair or bedside chair)?: None Help needed to walk in hospital room?: None Help needed climbing 3-5 steps with a railing? : None 6 Click Score: 24    End of Session Equipment Utilized During Treatment: Gait belt Activity Tolerance: Patient tolerated treatment well Patient left: in chair;with call bell/phone within reach Nurse Communication: Mobility status PT Visit Diagnosis: Other abnormalities of gait and mobility (R26.89)    Time: 1204-1219 PT Time Calculation (min) (ACUTE ONLY): 15 min   Charges:   PT Evaluation $PT Eval Low Complexity: 1 Low   PT General Charges $$ ACUTE PT VISIT: 1  Visit        Dorothyann Maier, DPT, CLT  Acute Rehabilitation Services Office: [phone] (Secure chat preferred)   Dorothyann VEAR Maier 05/15/2024, 12:29 PM

## 2024-05-15 NOTE — Discharge Summary (Signed)
 "    Patient ID: [name] [name] MRN: [id number] DOB/AGE: [birth year] 58 y.o.  Admit date: 05/15/2024 Discharge date: 05/15/2024  Admission Diagnoses:  Primary osteoarthritis of left hip  Discharge Diagnoses:  Principal Problem:   Primary osteoarthritis of left hip Active Problems:   Status post total replacement of left hip   Past Medical History:  Diagnosis Date   Anxiety    Arthritis    Coronary artery disease    Per CT Coronary completed 01/2024   DVT (deep venous thrombosis) (HCC)    Dysrhythmia    A.Fib   GERD (gastroesophageal reflux disease)    History of hiatal hernia    Hyperlipidemia    Hypertension    Sleep apnea     Surgeries: Procedures: ARTHROPLASTY, HIP, TOTAL, ANTERIOR APPROACH, LEFT on 05/15/2024   Consultants (if any):   Discharged Condition: Improved  Hospital Course: ZACHARIA SOWLES is an 58 y.o. male who was admitted 05/15/2024 with a diagnosis of Primary osteoarthritis of left hip and went to the operating room on 05/15/2024 and underwent the above named procedures.    He was given perioperative antibiotics:  Anti-infectives (From admission, onward)    Start     Dose/Rate Route Frequency Ordered Stop   05/15/24 1330  ceFAZolin  (ANCEF ) IVPB 2g/100 mL premix        2 g 200 mL/hr over 30 Minutes Intravenous Every 6 hours 05/15/24 1028 05/16/24 0129   05/15/24 0827  vancomycin  (VANCOCIN ) powder  Status:  Discontinued          As needed 05/15/24 0828 05/15/24 0919   05/15/24 0600  ceFAZolin  (ANCEF ) IVPB 2g/100 mL premix        2 g 200 mL/hr over 30 Minutes Intravenous On call to O.R. 05/15/24 [id number] 05/15/24 0741     .  He was given sequential compression devices, early ambulation, and appropriate chemoprophylaxis for DVT prophylaxis.  He benefited maximally from the hospital stay and there were no complications.    Recent vital signs:  Vitals:   05/15/24 1000 05/15/24 1020  BP: 132/81 129/83  Pulse: (!) 55 (!) 57  Resp: 13 16  Temp:  98.1 F (36.7 C) 98.2 F (36.8 C)  SpO2: 95% 96%    Recent laboratory studies:  Lab Results  Component Value Date   HGB 14.2 05/04/2024   HGB 13.6 01/18/2024   HGB 15.1 06/04/2023   Lab Results  Component Value Date   WBC 5.7 05/04/2024   PLT 249 05/04/2024   Lab Results  Component Value Date   INR 1.1 09/19/2008   Lab Results  Component Value Date   NA 143 05/04/2024   K 3.9 05/04/2024   CL 108 05/04/2024   CO2 29 05/04/2024   BUN 20 05/04/2024   CREATININE 1.11 05/04/2024   GLUCOSE 100 (H) 05/04/2024    Discharge Medications:   Allergies as of 05/15/2024   No Known Allergies      Medication List     TAKE these medications    amoxicillin  500 MG tablet Commonly known as: AMOXIL  Take 4 tablets by mouth 1 hour prior to dental procedure.   atorvastatin 40 MG tablet Commonly known as: LIPITOR Take 40 mg by mouth in the morning.   chlorhexidine  4 % external liquid Commonly known as: HIBICLENS  Apply 15 mLs (1 Application total) topically as directed for 30 doses. Use as directed daily for 5 days every other week for 6 weeks.   diltiazem  240 MG 24 hr capsule  Commonly known as: Cardizem  CD Take 1 capsule (240 mg total) by mouth daily.   docusate sodium  100 MG capsule Commonly known as: Colace Take 1 capsule (100 mg total) by mouth daily as needed.   FT Vitamin D3 250 MCG (10000 UT) Tabs Generic drug: Cholecalciferol Take 10,000 Units by mouth every Monday, Wednesday, and Friday. In the morning.   hydrochlorothiazide  25 MG tablet Commonly known as: HYDRODIURIL  Take 25 mg by mouth every morning.   lisinopril  20 MG tablet Commonly known as: ZESTRIL  Take 20 mg by mouth in the morning.   methocarbamol  750 MG tablet Commonly known as: ROBAXIN  Take 1 tablet (750 mg total) by mouth 3 (three) times daily as needed.   multivitamin with minerals Tabs tablet Take 1 tablet by mouth in the morning.   mupirocin  ointment 2 % Commonly known as:  BACTROBAN  Place 1 Application into the nose 2 (two) times daily for 60 doses. Use as directed 2 times daily for 5 days every other week for 6 weeks.   NON FORMULARY Pt uses a cpap nightly   ondansetron  4 MG tablet Commonly known as: Zofran  Take 1 tablet (4 mg total) by mouth every 8 (eight) hours as needed for nausea or vomiting.   oxyCODONE -acetaminophen  5-325 MG tablet Commonly known as: Percocet Take 1-2 tablets by mouth every 6 (six) hours as needed. To be taken after surgery   sertraline  50 MG tablet Commonly known as: ZOLOFT  Take 50 mg by mouth in the morning.   sildenafil 100 MG tablet Commonly known as: VIAGRA SMARTSIG:0.5-1 Tablet(s) By Mouth What changed: See the new instructions.   VITAMIN B COMPLEX PO Take 1 tablet by mouth in the morning.   Xarelto  20 MG Tabs tablet Generic drug: rivaroxaban  Take 20 mg by mouth in the morning.               Durable Medical Equipment  (From admission, onward)           Start     Ordered   05/15/24 1029  DME Walker rolling  Once       Question:  Patient needs a walker to treat with the following condition  Answer:  History of hip replacement   05/15/24 1028   05/15/24 1029  DME 3 n 1  Once        05/15/24 1028   05/15/24 1029  DME Bedside commode  Once       Question:  Patient needs a bedside commode to treat with the following condition  Answer:  History of hip replacement   05/15/24 1028            Diagnostic Studies: DG Pelvis Portable Result Date: 05/15/2024 EXAM: 1 or 2 VIEW(S) XRAY OF THE PELVIS 05/15/2024 09:33:00 AM COMPARISON: 10/26/2022 CLINICAL HISTORY: Pain FINDINGS: BONES AND JOINTS: Sequelae of interval left hip arthroplasty with components well seated and articulating appropriately. No acute fractures. Right hip arthroplasty noted. SOFT TISSUES: Left hip subcutaneous soft tissue edema and emphysema consistent with postsurgical changes. IMPRESSION: 1. Sequela of interval left hip arthroplasty with   no acute adverse findings. Electronically signed by: Michaeline Blanch MD 05/15/2024 01:44 PM EST RP Workstation: [id number]   DG HIP UNILAT WITH PELVIS 1V LEFT Result Date: 05/15/2024 EXAM: 1 VIEW(S) XRAY OF THE LEFT HIP 05/15/2024 08:52:00 AM COMPARISON: None available. CLINICAL HISTORY: 461500 Elective surgery 461500 Elective surgery FINDINGS: Fluoroscopy time: 21 seconds Cumulative dose: 4.5 mGy BONES AND JOINTS: Intraoperative fluoroscopic images for a left hip arthroplasty.  Left hip prosthetic components are well seated and appropriately aligned. No acute fractures. Partially imaged right hip arthroplasty hardware. Please see operative report for further details. SOFT TISSUES: The soft tissues are unremarkable. IMPRESSION: 1. Intraoperative fluoroscopic images for left hip arthroplasty. Please see operative report for full details. Electronically signed by: Michaeline Blanch MD 05/15/2024 01:42 PM EST RP Workstation: [id number]   DG C-Arm 1-60 Min-No Report Result Date: 05/15/2024 Fluoroscopy was utilized by the requesting physician.  No radiographic interpretation.   DG C-Arm 1-60 Min-No Report Result Date: 05/15/2024 Fluoroscopy was utilized by the requesting physician.  No radiographic interpretation.    Disposition: Discharge disposition: 01-Home or Self Care       Discharge Instructions     Call MD / Call 911   Complete by: As directed    If you experience chest pain or shortness of breath, CALL 911 and be transported to the hospital emergency room.  If you develope a fever above 101.5 F, pus (white drainage) or increased drainage or redness at the wound, or calf pain, call your surgeon's office.   Constipation Prevention   Complete by: As directed    Drink plenty of fluids.  Prune juice may be helpful.  You may use a stool softener, such as Colace (over the counter) 100 mg twice a day.  Use MiraLax  (over the counter) for constipation as needed.   Driving restrictions   Complete by: As  directed    No driving while taking narcotic pain meds.   Increase activity slowly as tolerated   Complete by: As directed    Post-operative opioid taper instructions:   Complete by: As directed    POST-OPERATIVE OPIOID TAPER INSTRUCTIONS: It is important to wean off of your opioid medication as soon as possible. If you do not need pain medication after your surgery it is ok to stop day one. Opioids include: Codeine, Hydrocodone (Norco, Vicodin), Oxycodone (Percocet, oxycontin ) and hydromorphone  amongst others.  Long term and even short term use of opiods can cause: Increased pain response Dependence Constipation Depression Respiratory depression And more.  Withdrawal symptoms can include Flu like symptoms Nausea, vomiting And more Techniques to manage these symptoms Hydrate well Eat regular healthy meals Stay active Use relaxation techniques(deep breathing, meditating, yoga) Do Not substitute Alcohol to help with tapering If you have been on opioids for less than two weeks and do not have pain than it is ok to stop all together.  Plan to wean off of opioids This plan should start within one week post op of your joint replacement. Maintain the same interval or time between taking each dose and first decrease the dose.  Cut the total daily intake of opioids by one tablet each day Next start to increase the time between doses. The last dose that should be eliminated is the evening dose.           Follow-up Information     Jule Ronal CROME, PA-C. Schedule an appointment as soon as possible for a visit in 2 week(s).   Specialty: Orthopedic Surgery Contact information: [address] [place]  [place] [address] [phone]                  Signed: Ozell Cummins 05/15/2024, 2:12 PM  "

## 2024-05-15 NOTE — Progress Notes (Shared)
 Pt. discharged home accompanied by wife. Prescriptions and discharge instructions given with verbalization of understanding. Incision site on hip with no s/s of infection - no swelling, redness, bleeding, and/or drainage noted. Opportunity given to ask questions but no question asked. Pt. transported out of this unit in wheelchair by the volunteer

## 2024-05-15 NOTE — H&P (Signed)
 "   PREOPERATIVE H&P  Chief Complaint: left hip osteoarthritis  HPI: [name] [name] is a 58 y.o. male who presents for surgical treatment of left hip osteoarthritis.  He denies any changes in medical history.  Past Surgical History:  Procedure Laterality Date   ATRIAL FIBRILLATION ABLATION N/A 02/08/2024   Procedure: ATRIAL FIBRILLATION ABLATION;  Surgeon: Inocencio Soyla Lunger, MD;  Location: [facility];  Service: Cardiovascular;  Laterality: N/A;   COLONOSCOPY  2016   INGUINAL HERNIA REPAIR Right 2021   TOTAL HIP ARTHROPLASTY Right 10/26/2022   Procedure: RIGHT TOTAL HIP ARTHROPLASTY ANTERIOR APPROACH;  Surgeon: Jerri Kay HERO, MD;  Location: [facility];  Service: Orthopedics;  Laterality: Right;  3-C   UMBILICAL HERNIA REPAIR     Social History   Socioeconomic History   Marital status: Married    Spouse name: Not on file   Number of children: Not on file   Years of education: Not on file   Highest education level: Not on file  Occupational History   Not on file  Tobacco Use   Smoking status: Never   Smokeless tobacco: Never  Vaping Use   Vaping status: Never Used  Substance and Sexual Activity   Alcohol use: Yes    Alcohol/week: 4.0 - 5.0 standard drinks of alcohol    Types: 4 - 5 Standard drinks or equivalent per week   Drug use: No   Sexual activity: Yes  Other Topics Concern   Not on file  Social History Narrative   Not on file   Social Drivers of Health   Tobacco Use: Low Risk (05/04/2024)   Patient History    Smoking Tobacco Use: Never    Smokeless Tobacco Use: Never    Passive Exposure: Not on file  Financial Resource Strain: Not on file  Food Insecurity: No Food Insecurity (10/26/2022)   Hunger Vital Sign    Worried About Running Out of Food in the Last Year: Never true    Ran Out of Food in the Last Year: Never true  Transportation Needs: No Transportation Needs (10/26/2022)   PRAPARE - Scientist, Research (physical Sciences) (Medical): No    Lack of Transportation (Non-Medical): No  Physical Activity: Not on file  Stress: Not on file  Social Connections: Not on file  Depression (EYV7-0): Not on file  Alcohol Screen: Not on file  Housing: Low Risk (10/26/2022)   Housing    Last Housing Risk Score: 0  Utilities: Not At Risk (10/26/2022)   [employer] Utilities    Threatened with loss of utilities: No  Health Literacy: Not on file   Family History  Problem Relation Age of Onset   Stroke Father    No Known Allergies Prior to Admission medications  Medication Sig Start Date End Date Taking? Authorizing Provider  amoxicillin  (AMOXIL ) 500 MG tablet Take 4 tablets by mouth 1 hour prior to dental procedure. 02/05/23  Yes Jerri Kay HERO, MD  atorvastatin (LIPITOR) 40 MG tablet Take 40 mg by mouth in the morning.   Yes [provider]  B Complex Vitamins (VITAMIN B COMPLEX PO) Take 1 tablet by mouth in the morning.   Yes [provider]  Cholecalciferol (FT VITAMIN D3) 250 MCG (10000 UT) TABS Take 10,000 Units by mouth every Monday, Wednesday, and Friday. In the morning.   Yes [provider]  diltiazem  (CARDIZEM  CD) 240 MG 24 hr capsule Take 1 capsule (240 mg total) by mouth daily. 07/14/23  Yes Tobb, Kardie, DO  hydrochlorothiazide  (HYDRODIURIL ) 25 MG tablet Take 25 mg by mouth every morning. 07/29/23  Yes [provider]  lisinopril  (ZESTRIL ) 20 MG tablet Take 20 mg by mouth in the morning. 08/13/23  Yes [provider]  Multiple Vitamin (MULTIVITAMIN WITH MINERALS) TABS tablet Take 1 tablet by mouth in the morning.   Yes [provider]  NON FORMULARY Pt uses a cpap nightly   Yes [provider]  sertraline  (ZOLOFT ) 50 MG tablet Take 50 mg by mouth in the morning.   Yes [provider]  XARELTO  20 MG TABS tablet Take 20 mg by mouth in the morning.   Yes [provider]  docusate sodium  (COLACE) 100 MG capsule Take 1 capsule  (100 mg total) by mouth daily as needed. 05/02/24 05/02/25  Jule Ronal CROME, PA-C  methocarbamol  (ROBAXIN ) 750 MG tablet Take 1 tablet (750 mg total) by mouth 3 (three) times daily as needed. 05/02/24   Jule Ronal CROME, PA-C  ondansetron  (ZOFRAN ) 4 MG tablet Take 1 tablet (4 mg total) by mouth every 8 (eight) hours as needed for nausea or vomiting. 05/02/24   Jule Ronal CROME, PA-C  oxyCODONE -acetaminophen  (PERCOCET) 5-325 MG tablet Take 1-2 tablets by mouth every 6 (six) hours as needed. To be taken after surgery 05/02/24   Jule Ronal CROME, PA-C  sildenafil (VIAGRA) 100 MG tablet SMARTSIG:0.5-1 Tablet(s) By Mouth Patient taking differently: Take 0.5-1 tablets by mouth as needed. 04/12/24   [provider]     Positive ROS: All other systems have been reviewed and were otherwise negative with the exception of those mentioned in the HPI and as above.  Physical Exam: General: Alert, no acute distress Cardiovascular: No pedal edema Respiratory: No cyanosis, no use of accessory musculature GI: abdomen soft Skin: No lesions in the area of chief complaint Neurologic: Sensation intact distally Psychiatric: Patient is competent for consent with normal mood and affect Lymphatic: no lymphedema  MUSCULOSKELETAL: exam stable  Assessment: left hip osteoarthritis  Plan: Plan for Procedures: ARTHROPLASTY, HIP, TOTAL, ANTERIOR APPROACH  The risks benefits and alternatives were discussed with the patient including but not limited to the risks of nonoperative treatment, versus surgical intervention including infection, bleeding, nerve injury,  blood clots, cardiopulmonary complications, morbidity, mortality, among others, and they were willing to proceed.   Ozell Cummins, MD 05/15/2024 5:39 AM  "

## 2024-05-16 ENCOUNTER — Encounter (HOSPITAL_COMMUNITY): Payer: Self-pay | Admitting: Orthopaedic Surgery

## 2024-05-30 ENCOUNTER — Encounter: Admitting: Physician Assistant

## 2024-05-31 ENCOUNTER — Ambulatory Visit (INDEPENDENT_AMBULATORY_CARE_PROVIDER_SITE_OTHER): Admitting: Physician Assistant

## 2024-05-31 DIAGNOSIS — Z96642 Presence of left artificial hip joint: Secondary | ICD-10-CM

## 2024-05-31 NOTE — Progress Notes (Signed)
 "  Post-Op Visit Note   Patient: [name] [name]           Date of Birth: [birth year]           MRN: [id number] Visit Date: 05/31/2024 PCP: Regino Slater, MD   Assessment & Plan:  Chief Complaint:  Chief Complaint  Patient presents with   Left Hip - Routine Post Op    05/15/2024-L THA   Visit Diagnoses:  1. Status post total replacement of left hip     Plan: Patient is a pleasant 59 year old gentleman who comes in today 2 weeks status post left total hip replacement.  He has been doing great.  He did not get any home health physical therapy.  Currently ambulating without assistance.  He does take Xarelto  on a regular basis.  He has been adding Tylenol  and NSAIDs on occasion with this for pain control.  Examination of his left hip reveals a well-healed surgical incision with nylon sutures in place.  No evidence of infection or cellulitis.  Today, sutures were removed and Steri-Strips applied.  He will advance with activity as tolerated.  I have discussed stopping any NSAIDs while on Xarelto .  He will follow-up in 4 weeks for repeat evaluation and AP pelvis x-rays.  Call with concerns or questions.  Follow-Up Instructions: Return in about 4 weeks (around 06/28/2024) for FU WITH XU.   Orders:  No orders of the defined types were placed in this encounter.  No orders of the defined types were placed in this encounter.   Imaging: No new imaging  PMFS History: Patient Active Problem List   Diagnosis Date Noted   Primary osteoarthritis of left hip 05/15/2024   Status post total replacement of left hip 05/15/2024   Status post total replacement of right hip 10/26/2022   Primary osteoarthritis of right hip 07/07/2022   Pain in left hip 07/07/2022   Gastroesophageal reflux disease without esophagitis 01/06/2017   Low testosterone in male 01/06/2017   Other hyperlipidemia 01/06/2017   Personal history of DVT (deep vein thrombosis) 01/06/2017   Varicose veins of right lower extremity  with complications 10/19/2016   Chronic deep vein thrombosis (DVT) 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) 10/19/2016   Acute venous embolism and thrombosis of deep vessels of distal lower extremity (HCC) 06/23/2011   Phlebitis and thrombophlebitis of superficial vessels of lower extremities 06/23/2011   Past Medical History:  Diagnosis Date   Anxiety    Arthritis    Coronary artery disease    Per CT Coronary completed 01/2024   DVT (deep venous thrombosis) (HCC)    Dysrhythmia    A.Fib   GERD (gastroesophageal reflux disease)    History of hiatal hernia    Hyperlipidemia    Hypertension    Sleep apnea     Family History  Problem Relation Age of Onset   Stroke Father     Past Surgical History:  Procedure Laterality Date   ATRIAL FIBRILLATION ABLATION N/A 02/08/2024   Procedure: ATRIAL FIBRILLATION ABLATION;  Surgeon: Inocencio Soyla Lunger, MD;  Location: [facility];  Service: Cardiovascular;  Laterality: N/A;   COLONOSCOPY  2016   INGUINAL HERNIA REPAIR Right 2021   TOTAL HIP ARTHROPLASTY Right 10/26/2022   Procedure: RIGHT TOTAL HIP ARTHROPLASTY ANTERIOR APPROACH;  Surgeon: Jerri Kay HERO, MD;  Location: [facility] OR;  Service: Orthopedics;  Laterality: Right;  3-C   TOTAL HIP ARTHROPLASTY Left 05/15/2024   Procedure: ARTHROPLASTY, HIP, TOTAL, ANTERIOR APPROACH, LEFT;  Surgeon: Jerri Kay HERO, MD;  Location: [facility];  Service: Orthopedics;  Laterality: Left;   UMBILICAL HERNIA REPAIR     Social History   Occupational History   Not on file  Tobacco Use   Smoking status: Never   Smokeless tobacco: Never  Vaping Use   Vaping status: Never Used  Substance and Sexual Activity   Alcohol use: Yes    Alcohol/week: 4.0 - 5.0 standard drinks of alcohol    Types: 4 - 5 Standard drinks or equivalent per week   Drug use: No   Sexual activity: Yes     "

## 2024-06-05 ENCOUNTER — Encounter: Payer: Self-pay | Admitting: Orthopaedic Surgery

## 2024-06-28 ENCOUNTER — Encounter: Admitting: Orthopaedic Surgery
